# Patient Record
Sex: Female | Born: 1945 | Hispanic: Yes | Smoking: Never smoker
Health system: Southern US, Community
[De-identification: ages and names within clinical notes are randomized; demographics above are authoritative.]

## PROBLEM LIST (undated history)

## (undated) DIAGNOSIS — E785 Hyperlipidemia, unspecified: Secondary | ICD-10-CM

## (undated) DIAGNOSIS — I252 Old myocardial infarction: Secondary | ICD-10-CM

## (undated) DIAGNOSIS — G8929 Other chronic pain: Secondary | ICD-10-CM

## (undated) DIAGNOSIS — C801 Malignant (primary) neoplasm, unspecified: Secondary | ICD-10-CM

## (undated) DIAGNOSIS — I10 Essential (primary) hypertension: Secondary | ICD-10-CM

## (undated) DIAGNOSIS — M25561 Pain in right knee: Secondary | ICD-10-CM

## (undated) HISTORY — DX: Malignant (primary) neoplasm, unspecified: C80.1

## (undated) HISTORY — DX: Hyperlipidemia, unspecified: E78.5

## (undated) HISTORY — PX: BREAST SURGERY: SHX581

## (undated) HISTORY — DX: Other chronic pain: G89.29

## (undated) HISTORY — DX: Essential (primary) hypertension: I10

## (undated) HISTORY — DX: Pain in right knee: M25.561

## (undated) HISTORY — DX: Old myocardial infarction: I25.2

---

## 1984-06-24 DIAGNOSIS — I252 Old myocardial infarction: Secondary | ICD-10-CM

## 1984-06-24 HISTORY — DX: Old myocardial infarction: I25.2

## 2011-11-22 DIAGNOSIS — R002 Palpitations: Secondary | ICD-10-CM | POA: Insufficient documentation

## 2011-11-22 DIAGNOSIS — Z86010 Personal history of colonic polyps: Secondary | ICD-10-CM | POA: Insufficient documentation

## 2011-11-22 DIAGNOSIS — I10 Essential (primary) hypertension: Secondary | ICD-10-CM | POA: Insufficient documentation

## 2011-11-22 DIAGNOSIS — J309 Allergic rhinitis, unspecified: Secondary | ICD-10-CM | POA: Insufficient documentation

## 2013-12-15 DIAGNOSIS — Z0289 Encounter for other administrative examinations: Secondary | ICD-10-CM | POA: Insufficient documentation

## 2014-09-15 DIAGNOSIS — Z91199 Patient's noncompliance with other medical treatment and regimen due to unspecified reason: Secondary | ICD-10-CM | POA: Insufficient documentation

## 2014-09-15 DIAGNOSIS — Z9119 Patient's noncompliance with other medical treatment and regimen: Secondary | ICD-10-CM | POA: Insufficient documentation

## 2014-12-13 DIAGNOSIS — N183 Chronic kidney disease, stage 3 unspecified: Secondary | ICD-10-CM | POA: Insufficient documentation

## 2014-12-13 DIAGNOSIS — N1832 Chronic kidney disease, stage 3b: Secondary | ICD-10-CM | POA: Insufficient documentation

## 2015-07-17 DIAGNOSIS — Z13820 Encounter for screening for osteoporosis: Secondary | ICD-10-CM | POA: Diagnosis not present

## 2015-07-17 DIAGNOSIS — G8929 Other chronic pain: Secondary | ICD-10-CM | POA: Diagnosis not present

## 2015-07-17 DIAGNOSIS — I10 Essential (primary) hypertension: Secondary | ICD-10-CM | POA: Diagnosis not present

## 2015-07-17 DIAGNOSIS — M25569 Pain in unspecified knee: Secondary | ICD-10-CM | POA: Diagnosis not present

## 2015-07-17 DIAGNOSIS — E785 Hyperlipidemia, unspecified: Secondary | ICD-10-CM | POA: Diagnosis not present

## 2015-08-03 DIAGNOSIS — J329 Chronic sinusitis, unspecified: Secondary | ICD-10-CM | POA: Diagnosis not present

## 2015-08-31 ENCOUNTER — Encounter: Payer: Self-pay | Admitting: Family Medicine

## 2015-08-31 DIAGNOSIS — M818 Other osteoporosis without current pathological fracture: Secondary | ICD-10-CM | POA: Diagnosis not present

## 2015-09-05 DIAGNOSIS — M8580 Other specified disorders of bone density and structure, unspecified site: Secondary | ICD-10-CM | POA: Insufficient documentation

## 2015-09-11 DIAGNOSIS — Z79899 Other long term (current) drug therapy: Secondary | ICD-10-CM | POA: Diagnosis not present

## 2015-09-11 DIAGNOSIS — M8580 Other specified disorders of bone density and structure, unspecified site: Secondary | ICD-10-CM | POA: Diagnosis not present

## 2015-09-11 DIAGNOSIS — I10 Essential (primary) hypertension: Secondary | ICD-10-CM | POA: Diagnosis not present

## 2015-10-03 DIAGNOSIS — I10 Essential (primary) hypertension: Secondary | ICD-10-CM | POA: Diagnosis not present

## 2015-10-03 DIAGNOSIS — Z79899 Other long term (current) drug therapy: Secondary | ICD-10-CM | POA: Diagnosis not present

## 2015-10-23 DIAGNOSIS — T2661XA Corrosion of cornea and conjunctival sac, right eye, initial encounter: Secondary | ICD-10-CM | POA: Diagnosis not present

## 2015-10-23 DIAGNOSIS — T543X1A Toxic effect of corrosive alkalis and alkali-like substances, accidental (unintentional), initial encounter: Secondary | ICD-10-CM | POA: Diagnosis not present

## 2015-10-24 DIAGNOSIS — H16141 Punctate keratitis, right eye: Secondary | ICD-10-CM | POA: Diagnosis not present

## 2015-10-26 DIAGNOSIS — H40033 Anatomical narrow angle, bilateral: Secondary | ICD-10-CM | POA: Diagnosis not present

## 2015-10-26 DIAGNOSIS — H1011 Acute atopic conjunctivitis, right eye: Secondary | ICD-10-CM | POA: Diagnosis not present

## 2015-11-02 DIAGNOSIS — H1011 Acute atopic conjunctivitis, right eye: Secondary | ICD-10-CM | POA: Diagnosis not present

## 2015-11-10 DIAGNOSIS — H40033 Anatomical narrow angle, bilateral: Secondary | ICD-10-CM | POA: Diagnosis not present

## 2015-11-13 DIAGNOSIS — H409 Unspecified glaucoma: Secondary | ICD-10-CM | POA: Insufficient documentation

## 2015-11-30 DIAGNOSIS — H2513 Age-related nuclear cataract, bilateral: Secondary | ICD-10-CM | POA: Diagnosis not present

## 2016-01-18 DIAGNOSIS — E784 Other hyperlipidemia: Secondary | ICD-10-CM | POA: Diagnosis not present

## 2016-01-18 DIAGNOSIS — M25552 Pain in left hip: Secondary | ICD-10-CM | POA: Diagnosis not present

## 2016-01-18 DIAGNOSIS — M25551 Pain in right hip: Secondary | ICD-10-CM | POA: Diagnosis not present

## 2016-01-18 DIAGNOSIS — M1710 Unilateral primary osteoarthritis, unspecified knee: Secondary | ICD-10-CM | POA: Diagnosis not present

## 2016-01-18 DIAGNOSIS — T887XXD Unspecified adverse effect of drug or medicament, subsequent encounter: Secondary | ICD-10-CM | POA: Diagnosis not present

## 2016-01-18 DIAGNOSIS — I10 Essential (primary) hypertension: Secondary | ICD-10-CM | POA: Diagnosis not present

## 2016-02-20 DIAGNOSIS — M1710 Unilateral primary osteoarthritis, unspecified knee: Secondary | ICD-10-CM | POA: Diagnosis not present

## 2016-02-20 DIAGNOSIS — T887XXD Unspecified adverse effect of drug or medicament, subsequent encounter: Secondary | ICD-10-CM | POA: Diagnosis not present

## 2016-06-06 DIAGNOSIS — N183 Chronic kidney disease, stage 3 (moderate): Secondary | ICD-10-CM | POA: Diagnosis not present

## 2016-06-06 DIAGNOSIS — G8929 Other chronic pain: Secondary | ICD-10-CM | POA: Diagnosis not present

## 2016-06-06 DIAGNOSIS — M25562 Pain in left knee: Secondary | ICD-10-CM | POA: Diagnosis not present

## 2016-06-06 DIAGNOSIS — I10 Essential (primary) hypertension: Secondary | ICD-10-CM | POA: Diagnosis not present

## 2016-10-07 DIAGNOSIS — I2510 Atherosclerotic heart disease of native coronary artery without angina pectoris: Secondary | ICD-10-CM | POA: Insufficient documentation

## 2016-10-07 DIAGNOSIS — N183 Chronic kidney disease, stage 3 (moderate): Secondary | ICD-10-CM | POA: Diagnosis not present

## 2016-10-07 DIAGNOSIS — M8580 Other specified disorders of bone density and structure, unspecified site: Secondary | ICD-10-CM | POA: Diagnosis not present

## 2016-10-07 DIAGNOSIS — Z1211 Encounter for screening for malignant neoplasm of colon: Secondary | ICD-10-CM | POA: Diagnosis not present

## 2016-10-07 DIAGNOSIS — M25562 Pain in left knee: Secondary | ICD-10-CM | POA: Diagnosis not present

## 2016-10-07 DIAGNOSIS — I10 Essential (primary) hypertension: Secondary | ICD-10-CM | POA: Diagnosis not present

## 2016-10-07 DIAGNOSIS — G8929 Other chronic pain: Secondary | ICD-10-CM | POA: Diagnosis not present

## 2016-10-07 DIAGNOSIS — M25561 Pain in right knee: Secondary | ICD-10-CM | POA: Diagnosis not present

## 2017-03-31 DIAGNOSIS — J310 Chronic rhinitis: Secondary | ICD-10-CM | POA: Diagnosis not present

## 2017-03-31 DIAGNOSIS — I10 Essential (primary) hypertension: Secondary | ICD-10-CM | POA: Diagnosis not present

## 2017-03-31 DIAGNOSIS — L720 Epidermal cyst: Secondary | ICD-10-CM | POA: Diagnosis not present

## 2017-04-03 ENCOUNTER — Encounter: Payer: Self-pay | Admitting: Family Medicine

## 2017-04-03 DIAGNOSIS — R220 Localized swelling, mass and lump, head: Secondary | ICD-10-CM | POA: Diagnosis not present

## 2017-04-26 DIAGNOSIS — L03114 Cellulitis of left upper limb: Secondary | ICD-10-CM | POA: Diagnosis not present

## 2017-04-28 DIAGNOSIS — L720 Epidermal cyst: Secondary | ICD-10-CM | POA: Diagnosis not present

## 2017-04-28 DIAGNOSIS — N183 Chronic kidney disease, stage 3 (moderate): Secondary | ICD-10-CM | POA: Diagnosis not present

## 2017-04-28 DIAGNOSIS — I10 Essential (primary) hypertension: Secondary | ICD-10-CM | POA: Diagnosis not present

## 2017-06-19 DIAGNOSIS — J029 Acute pharyngitis, unspecified: Secondary | ICD-10-CM | POA: Diagnosis not present

## 2017-06-19 DIAGNOSIS — J019 Acute sinusitis, unspecified: Secondary | ICD-10-CM | POA: Diagnosis not present

## 2017-08-07 DIAGNOSIS — M25561 Pain in right knee: Secondary | ICD-10-CM | POA: Diagnosis not present

## 2017-08-07 DIAGNOSIS — L720 Epidermal cyst: Secondary | ICD-10-CM | POA: Diagnosis not present

## 2017-08-08 DIAGNOSIS — M1711 Unilateral primary osteoarthritis, right knee: Secondary | ICD-10-CM | POA: Diagnosis not present

## 2017-08-13 DIAGNOSIS — M1711 Unilateral primary osteoarthritis, right knee: Secondary | ICD-10-CM | POA: Diagnosis not present

## 2017-09-09 DIAGNOSIS — M25561 Pain in right knee: Secondary | ICD-10-CM | POA: Diagnosis not present

## 2017-09-09 DIAGNOSIS — M25562 Pain in left knee: Secondary | ICD-10-CM | POA: Diagnosis not present

## 2017-09-09 DIAGNOSIS — G8929 Other chronic pain: Secondary | ICD-10-CM | POA: Diagnosis not present

## 2017-09-09 DIAGNOSIS — I10 Essential (primary) hypertension: Secondary | ICD-10-CM | POA: Diagnosis not present

## 2017-11-13 ENCOUNTER — Ambulatory Visit (INDEPENDENT_AMBULATORY_CARE_PROVIDER_SITE_OTHER): Payer: Medicare Other | Admitting: Family Medicine

## 2017-11-13 ENCOUNTER — Telehealth: Payer: Self-pay | Admitting: Family Medicine

## 2017-11-13 ENCOUNTER — Other Ambulatory Visit: Payer: Self-pay

## 2017-11-13 ENCOUNTER — Encounter: Payer: Self-pay | Admitting: Family Medicine

## 2017-11-13 VITALS — BP 110/76 | HR 98 | Temp 98.6°F | Ht 60.39 in | Wt 131.4 lb

## 2017-11-13 DIAGNOSIS — E785 Hyperlipidemia, unspecified: Secondary | ICD-10-CM

## 2017-11-13 DIAGNOSIS — I10 Essential (primary) hypertension: Secondary | ICD-10-CM | POA: Diagnosis not present

## 2017-11-13 DIAGNOSIS — Z0000 Encounter for general adult medical examination without abnormal findings: Secondary | ICD-10-CM | POA: Diagnosis not present

## 2017-11-13 DIAGNOSIS — M25561 Pain in right knee: Secondary | ICD-10-CM

## 2017-11-13 DIAGNOSIS — G8929 Other chronic pain: Secondary | ICD-10-CM | POA: Diagnosis not present

## 2017-11-13 DIAGNOSIS — Z5181 Encounter for therapeutic drug level monitoring: Secondary | ICD-10-CM

## 2017-11-13 MED ORDER — TRAMADOL HCL 50 MG PO TABS: 50.0000 mg | ORAL_TABLET | Freq: Two times a day (BID) | ORAL | 1 refills | Status: DC

## 2017-11-13 NOTE — Patient Instructions (Signed)
     IF you received an x-ray today, you will receive an invoice from [facility]. Please contact [facility] at [phone] with questions or concerns regarding your invoice.   IF you received labwork today, you will receive an invoice from [facility]. Please contact [facility] at [phone] with questions or concerns regarding your invoice.   Our billing staff will not be able to assist you with questions regarding bills from these companies.  You will be contacted with the lab results as soon as they are available. The fastest way to get your results is to activate your My Chart account. Instructions are located on the last page of this paperwork. If you have not heard from us regarding the results in 2 weeks, please contact this office.     

## 2017-11-13 NOTE — Progress Notes (Signed)
5/23/20194:16 PM  Gina Ball 03-Jul-1945, 72 y.o. female [id number]  Chief Complaint  Patient presents with  . Establish Care    Just moved here from [place], having right knee pain for 1 yr, has had injections done for the pain with no resolve. Does not want to do that again.   . Medication Refill    needing refill on tramadol for knees pain    HPI:   Patient is a 72 y.o. female with past medical history significant for HTN, chronic pain of right knee, HLP who presents today to establish care  She just moved in with her daughter in [place], [place] from [place], [place] She is requesting refill of tramadol, takes twice a day, wears a sleeve, will do mortin prn, walks daily, does strengthening exercises. Injections provided very minimal relief. Denies any side effects of tramadol.  She takes her BP meds as prescribed. Monitors her salt intake, exercises and keeps a healthy weight She denies any side effects from medications She used to walk 10 miles in [place] wo any cardiac sx, working on finding route in [place]  She manages her cholesterol with LFM, her last LDL in Oct 129, her HDL 71  She reports a normal colonoscopy years ago, done at [facility], does not which to repeat She reports both pneumonia vaccines about 4-5 years ago She had a dexa in 08/26/2015 Has not had a mammogram in years, declines [facility]ova withness, widowed  Does not need refill of BP meds yet [employer] order will start June 1st  X-rays were reviewed of the right knee. These were taken on 08/13/17 and 08/07/17. These demonstrate no fracture or dislocation. There is medial joint space narrowing. There are some peripheral osteophytes seen along the lateral femoral condyle. The patella is well centered. Normal bone quality.   Records reviewed via care everywhere Last knee injection done 07/2017 Last tramadol refill given 08/2017 by previous PCP [facility] CSR reviewed, appropriate    Fall Risk  11/13/2017  Falls in the  past year? No     Depression screen PHQ 2/9 11/13/2017  Decreased Interest 0  Down, Depressed, Hopeless 0  PHQ - 2 Score 0    No Known Allergies  Prior to Admission medications   Medication Sig Start Date End Date Taking? Authorizing Provider  hydrochlorothiazide (HYDRODIURIL) 25 MG tablet Take 25 mg by mouth daily.   Yes [provider]  lisinopril (PRINIVIL,ZESTRIL) 40 MG tablet Take 40 mg by mouth daily.   Yes [provider]  traMADol (ULTRAM) 50 MG tablet Take by mouth every 6 (six) hours as needed.   Yes [provider]    Past Medical History:  Diagnosis Date  . Cancer ([place])    HAD BREST CANCER 1977  . Chronic pain of right knee   . H/O myocardial infarction, greater than 8 weeks 1986   reports normal NM Scan  . HTN (hypertension)   . Hyperlipidemia     Past Surgical History:  Procedure Laterality Date  . BREAST SURGERY     BILATERAL    Social History   Tobacco Use  . Smoking status: Never Smoker  . Smokeless tobacco: Never Used  Substance Use Topics  . Alcohol use: Yes    Comment: OCCASIONALLY    Family History  Problem Relation Age of Onset  . Healthy Daughter   . Lymphoma Son     Review of Systems  Constitutional: Negative for chills, fever, malaise/fatigue and weight loss.  HENT:  Negative for congestion, ear pain and sore throat.   Eyes: Negative for blurred vision and double vision.  Respiratory: Negative for cough and shortness of breath.   Cardiovascular: Negative for chest pain, palpitations and leg swelling.  Gastrointestinal: Negative for abdominal pain, blood in stool, constipation, diarrhea, melena, nausea and vomiting.  Genitourinary: Negative for dysuria and hematuria.  Musculoskeletal: Positive for joint pain. Negative for falls.  Neurological: Negative for dizziness, focal weakness and headaches.  Psychiatric/Behavioral: Negative for depression. The patient is not nervous/anxious and does not have insomnia.    All other systems reviewed and are negative.    OBJECTIVE:  Blood pressure 110/76, pulse 98, temperature 98.6 F (37 C), temperature source Oral, height 5' 0.39" (1.534 m), weight 131 lb 6.4 oz (59.6 kg), SpO2 99 %.  Physical Exam  Constitutional: She is oriented to person, place, and time. She appears well-developed and well-nourished.  HENT:  Head: Normocephalic and atraumatic.  Right Ear: Hearing, tympanic membrane, external ear and ear canal normal.  Left Ear: Hearing, tympanic membrane, external ear and ear canal normal.  Mouth/Throat: Oropharynx is clear and moist.  Eyes: Pupils are equal, round, and reactive to light. EOM are normal.  Neck: Neck supple. No thyromegaly present.  Cardiovascular: Normal rate, regular rhythm, normal heart sounds and intact distal pulses. Exam reveals no gallop and no friction rub.  No murmur heard. Pulmonary/Chest: Effort normal and breath sounds normal. She has no wheezes. She has no rales.  Abdominal: Soft. Bowel sounds are normal. She exhibits no distension and no mass. There is no tenderness.  Musculoskeletal: Normal range of motion. She exhibits no edema.  Lymphadenopathy:    She has no cervical adenopathy.  Neurological: She is alert and oriented to person, place, and time. She has normal reflexes.  Skin: Skin is warm and dry.  Psychiatric: She has a normal mood and affect.  Nursing note and vitals reviewed.   ASSESSMENT and PLAN  1. Essential hypertension, benign Controlled, cont current regime. She will call for refills when needed. - CBC - Comprehensive metabolic panel - TSH  2. Chronic pain of right knee Previous records and [facility] CSR reviewed. I will assume care of her tramadol rx. Plan to transition to [facility] mail order pharmacy once in place, but for now will [facility]. - ToxASSURE Select 13 (MW), Urine - Tramadol Screen, Urine - traMADol (ULTRAM) 50 MG tablet; Take 1 tablet (50 mg total) by mouth 2 (two) times daily.  3.  Hyperlipidemia, unspecified hyperlipidemia type Last LDL ok. Patient manages with LFM, declines statin therapy - Lipid panel - TSH  4. Encounter for therapeutic drug monitoring - ToxASSURE Select 13 (MW), Urine - Tramadol Screen, Urine  5. Encounter for medical examination to establish care PMH, PSH, meds, allergies, Fhx, Shx, reviewed with patient today.   Return in about 6 months (around 05/16/2018).    Rutherford Guys, MD Primary Care at [place], [place] [address] Ph.  [phone] Fax [phone]

## 2017-11-13 NOTE — Telephone Encounter (Signed)
Copied from [place] [id number]. Topic: Quick Communication - Rx Refill/Question >> Nov 13, 2017  6:24 PM Marin Olp L wrote: Medication: Tramedol (need to confirm it;s okay to fill early)  Has the patient contacted their pharmacy? Yes.   (Agent: If no, request that the patient contact the pharmacy for the refill.) (Agent: If yes, when and what did the pharmacy advise?)  Preferred Pharmacy (with phone number or street name): [facility] [id number], [place] - [place] [address] [place] [address] Phone: [phone] Fax: [phone]  Agent: Please be advised that RX refills may take up to 3 business days. We ask that you follow-up with your pharmacy.  Was filled on 09/09/2017 at [facility] in [place].

## 2017-11-14 LAB — COMPREHENSIVE METABOLIC PANEL
ALT: 14 IU/L (ref 0–32)
AST: 19 IU/L (ref 0–40)
Albumin/Globulin Ratio: 1.7 (ref 1.2–2.2)
Albumin: 4.3 g/dL (ref 3.5–4.8)
Alkaline Phosphatase: 44 IU/L (ref 39–117)
BUN/Creatinine Ratio: 23 (ref 12–28)
BUN: 26 mg/dL (ref 8–27)
Bilirubin Total: 0.3 mg/dL (ref 0.0–1.2)
CO2: 24 mmol/L (ref 20–29)
Calcium: 9.3 mg/dL (ref 8.7–10.3)
Chloride: 107 mmol/L — ABNORMAL HIGH (ref 96–106)
Creatinine, Ser: 1.11 mg/dL — ABNORMAL HIGH (ref 0.57–1.00)
GFR calc Af Amer: 57 mL/min/{1.73_m2} — ABNORMAL LOW (ref >59)
GFR calc non Af Amer: 50 mL/min/{1.73_m2} — ABNORMAL LOW (ref >59)
Globulin, Total: 2.5 g/dL (ref 1.5–4.5)
Glucose: 86 mg/dL (ref 65–99)
Potassium: 4.2 mmol/L (ref 3.5–5.2)
Sodium: 147 mmol/L — ABNORMAL HIGH (ref 134–144)
Total Protein: 6.8 g/dL (ref 6.0–8.5)

## 2017-11-14 LAB — LIPID PANEL
Chol/HDL Ratio: 3.3 ratio (ref 0.0–4.4)
Cholesterol, Total: 229 mg/dL — ABNORMAL HIGH (ref 100–199)
HDL: 70 mg/dL (ref >39)
LDL Calculated: 111 mg/dL — ABNORMAL HIGH (ref 0–99)
Triglycerides: 239 mg/dL — ABNORMAL HIGH (ref 0–149)
VLDL Cholesterol Cal: 48 mg/dL — ABNORMAL HIGH (ref 5–40)

## 2017-11-14 LAB — TSH: TSH: 1.85 u[IU]/mL (ref 0.450–4.500)

## 2017-11-14 LAB — CBC
Hematocrit: 33 % — ABNORMAL LOW (ref 34.0–46.6)
Hemoglobin: 10.9 g/dL — ABNORMAL LOW (ref 11.1–15.9)
MCH: 31.1 pg (ref 26.6–33.0)
MCHC: 33 g/dL (ref 31.5–35.7)
MCV: 94 fL (ref 79–97)
Platelets: 282 10*3/uL (ref 150–450)
RBC: 3.5 x10E6/uL — ABNORMAL LOW (ref 3.77–5.28)
RDW: 12.9 % (ref 12.3–15.4)
WBC: 4.6 10*3/uL (ref 3.4–10.8)

## 2017-11-14 NOTE — Telephone Encounter (Signed)
Phone call to [facility], relayed it is OK to dispense tramadol to patient.

## 2017-11-22 LAB — TOXASSURE SELECT 13 (MW), URINE

## 2017-11-22 LAB — TRAMADOL SCREEN, URINE: Tramadol Screen, Urine: POSITIVE ng/mL — AB

## 2017-12-02 ENCOUNTER — Ambulatory Visit (INDEPENDENT_AMBULATORY_CARE_PROVIDER_SITE_OTHER): Payer: Medicare HMO | Admitting: Family Medicine

## 2017-12-02 ENCOUNTER — Other Ambulatory Visit: Payer: Self-pay

## 2017-12-02 ENCOUNTER — Encounter: Payer: Self-pay | Admitting: Family Medicine

## 2017-12-02 VITALS — BP 128/70 | HR 79 | Temp 98.6°F | Resp 16 | Ht 60.39 in | Wt 131.0 lb

## 2017-12-02 DIAGNOSIS — E785 Hyperlipidemia, unspecified: Secondary | ICD-10-CM

## 2017-12-02 DIAGNOSIS — I10 Essential (primary) hypertension: Secondary | ICD-10-CM

## 2017-12-02 DIAGNOSIS — M1711 Unilateral primary osteoarthritis, right knee: Secondary | ICD-10-CM | POA: Diagnosis not present

## 2017-12-02 MED ORDER — LISINOPRIL 40 MG PO TABS
40.0000 mg | ORAL_TABLET | Freq: Every day | ORAL | 3 refills | Status: DC
Start: 2017-12-02 — End: 2018-09-22

## 2017-12-02 MED ORDER — HYDROCHLOROTHIAZIDE 25 MG PO TABS: 25.0000 mg | ORAL_TABLET | Freq: Every day | ORAL | 3 refills | Status: DC

## 2017-12-02 MED ORDER — METHOCARBAMOL 500 MG PO TABS: 500.0000 mg | ORAL_TABLET | Freq: Three times a day (TID) | ORAL | 0 refills | Status: DC | PRN

## 2017-12-02 MED ORDER — TRAMADOL HCL 50 MG PO TABS: 50.0000 mg | ORAL_TABLET | Freq: Two times a day (BID) | ORAL | 5 refills | Status: DC

## 2017-12-02 NOTE — Patient Instructions (Addendum)
  I have ordered either a referral or a diagnostic image. Please be mindful that it usually takes about 2 weeks to be called for an appointment. However if you have not be called for your appointment, please call us at [phone] and ask to speak with a referral clerk to further inquire about either your referral of diagnostic image.   IF you received an x-ray today, you will receive an invoice from [facility]. Please contact [facility] at [phone] with questions or concerns regarding your invoice.   IF you received labwork today, you will receive an invoice from [facility]. Please contact [facility] at [phone] with questions or concerns regarding your invoice.   Our billing staff will not be able to assist you with questions regarding bills from these companies.  You will be contacted with the lab results as soon as they are available. The fastest way to get your results is to activate your My Chart account. Instructions are located on the last page of this paperwork. If you have not heard from us regarding the results in 2 weeks, please contact this office.     

## 2017-12-02 NOTE — Progress Notes (Signed)
6/11/20199:37 AM  Gina Ball 02-21-1946, 72 y.o. female [id number]  Chief Complaint  Patient presents with  . Right knee (its the muscle to the side of the knee) x 2  yrs    over past few months worsening, pain level 10/10 and tramadol not helping with the pain, ? steroid injection, last one she received in Feb didn't help, pt unable to sleep, walk or drive due to pain.     HPI:   Patient is a 72 y.o. female with past medical history significant for HTN, HLP and chronic R knee pain 2/2 DJD who presents today for worsening right knee pain  It is constant, medial aspect. Worse at night, sleeping with knee slightly been helps some but then causes spasms of her thigh muscles.  Flexeril in the past caused dizziness, confusion She has not done PT, steroid injection did not help She has not established with ortho here in [place]  She is also requesting meds to be changed to [place] for 90 days  Seen by ortho, Dr Alain Marion in Feb 2019, xrays c/w medial compartment DJD with bone spurs. Steroid injection given  Fall Risk  12/02/2017 11/13/2017  Falls in the past year? No No     Depression screen [facility] 2/9 12/02/2017 11/13/2017  Decreased Interest 0 0  Down, Depressed, Hopeless 0 0  PHQ - 2 Score 0 0    No Known Allergies  Prior to Admission medications   Medication Sig Start Date End Date Taking? Authorizing Provider  aspirin EC 81 MG tablet Take 81 mg by mouth daily.   Yes [provider]  B Complex-Biotin-FA (B-100 B-COMPLEX) TABS Take by mouth.   Yes [provider]  Biotin 1 MG CAPS Take 1 capsule by mouth daily.   Yes [provider]  diphenhydrAMINE (BENADRYL ALLERGY) 25 mg capsule Take 25 mg by mouth daily as needed for allergies.   Yes [provider]  hydrochlorothiazide (HYDRODIURIL) 25 MG tablet Take 25 mg by mouth daily.   Yes [provider]  lisinopril (PRINIVIL,ZESTRIL) 40 MG tablet Take 40 mg by mouth daily.   Yes [provider]  Multiple Vitamin (MULTIVITAMIN) tablet Take 1 tablet by mouth daily.   Yes [provider]  traMADol (ULTRAM) 50 MG tablet Take 1 tablet (50 mg total) by mouth 2 (two) times daily. 11/13/17  Yes Rutherford Guys, MD    Past Medical History:  Diagnosis Date  . Cancer ([place])    HAD BREST CANCER 1977  . Chronic pain of right knee   . H/O myocardial infarction, greater than 8 weeks 1986   reports normal NM Scan  . HTN (hypertension)   . Hyperlipidemia     Past Surgical History:  Procedure Laterality Date  . BREAST SURGERY     BILATERAL    Social History   Tobacco Use  . Smoking status: Never Smoker  . Smokeless tobacco: Never Used  Substance Use Topics  . Alcohol use: Yes    Comment: OCCASIONALLY    Family History  Problem Relation Age of Onset  . Healthy Daughter   . Lymphoma Son     Review of Systems  Constitutional: Negative for chills and fever.  Respiratory: Negative for cough and shortness of breath.   Cardiovascular: Negative for chest pain, palpitations and leg swelling.  Gastrointestinal: Negative for abdominal pain, nausea and vomiting.     OBJECTIVE:  Blood pressure 128/70, pulse 79, temperature 98.6 F (37 C), temperature source  Oral, resp. rate 16, height 5' 0.39" (1.534 m), weight 131 lb (59.4 kg), SpO2 98 %.  Physical Exam  Constitutional: She is oriented to person, place, and time.  HENT:  Head: Normocephalic and atraumatic.  Mouth/Throat: Mucous membranes are normal.  Eyes: Pupils are equal, round, and reactive to light. EOM are normal. No scleral icterus.  Neck: Neck supple.  Pulmonary/Chest: Effort normal.  Neurological: She is alert and oriented to person, place, and time.  Skin: Skin is warm and dry.  Nursing note and vitals reviewed.   ASSESSMENT and PLAN  1. Primary osteoarthritis of right knee Uncontrolled. Referring to PT to help with quad stretching and mobility. Adding methocarbamol specially at bedtime  to help with spasms. Referring to ortho to eval for surgical interventions. New med r/se/b reviewed. - Ambulatory referral to Physical Therapy - Ambulatory referral to Orthopedic Surgery  2. Essential hypertension, benign At goal, cont current regime  3. Hyperlipidemia, unspecified hyperlipidemia type LDL 111, patient prefers to continue with dietary modifications.   Other orders - methocarbamol (ROBAXIN) 500 MG tablet; Take 1 tablet (500 mg total) by mouth every 8 (eight) hours as needed for muscle spasms.- [facility] - hydrochlorothiazide (HYDRODIURIL) 25 MG tablet; Take 1 tablet (25 mg total) by mouth daily. - sent to [facility] - lisinopril (PRINIVIL,ZESTRIL) 40 MG tablet; Take 1 tablet (40 mg total) by mouth daily.- sent to [facility] - traMADol (ULTRAM) 50 MG tablet; Take 1 tablet (50 mg total) by mouth 2 (two) times daily.- sent to [facility]  Return in about 3 months (around 03/04/2018).    Rutherford Guys, MD Primary Care at [place], [place] [address] Ph.  [phone] Fax [phone]

## 2017-12-11 ENCOUNTER — Ambulatory Visit (INDEPENDENT_AMBULATORY_CARE_PROVIDER_SITE_OTHER): Payer: Medicare HMO

## 2017-12-11 ENCOUNTER — Encounter (INDEPENDENT_AMBULATORY_CARE_PROVIDER_SITE_OTHER): Payer: Self-pay | Admitting: Orthopaedic Surgery

## 2017-12-11 ENCOUNTER — Ambulatory Visit (INDEPENDENT_AMBULATORY_CARE_PROVIDER_SITE_OTHER): Payer: Medicare HMO | Admitting: Orthopaedic Surgery

## 2017-12-11 VITALS — BP 132/73 | HR 83 | Ht 60.38 in | Wt 131.0 lb

## 2017-12-11 DIAGNOSIS — M25561 Pain in right knee: Secondary | ICD-10-CM | POA: Diagnosis not present

## 2017-12-11 DIAGNOSIS — G8929 Other chronic pain: Secondary | ICD-10-CM | POA: Diagnosis not present

## 2017-12-11 MED ORDER — BUPIVACAINE HCL 0.5 % IJ SOLN
2.0000 mL | INTRAMUSCULAR | Status: AC | PRN
  Administered 2017-12-11: 2 mL via INTRA_ARTICULAR

## 2017-12-11 MED ORDER — LIDOCAINE HCL 1 % IJ SOLN
2.0000 mL | INTRAMUSCULAR | Status: AC | PRN
  Administered 2017-12-11: 2 mL

## 2017-12-11 MED ORDER — METHYLPREDNISOLONE ACETATE 40 MG/ML IJ SUSP
80.0000 mg | INTRAMUSCULAR | Status: AC | PRN
  Administered 2017-12-11: 80 mg

## 2017-12-11 NOTE — Progress Notes (Signed)
Office Visit Note   Patient: Gina Ball           Date of Birth: 1945/11/16           MRN: [id number] Visit Date: 12/11/2017              Requested by: Rutherford Guys, MD [address] [place], [place] [address] PCP: Rutherford Guys, MD   Assessment & Plan: Visit Diagnoses:  1. Chronic pain of right knee     Plan: Moderate tricompartmental osteoarthritis right knee.  Long discussion regarding diagnosis and treatment options.  Will inject the knee with cortisone and monitor response over the next 3 to 4 weeks.  Consider Visco supplementation.  Patient is aware of the total knee replacement option  Follow-Up Instructions: Return if symptoms worsen or fail to improve.   Orders:  Orders Placed This Encounter  Procedures  . Large Joint Inj: R knee  . XR KNEE 3 VIEW RIGHT   No orders of the defined types were placed in this encounter.     Procedures: Large Joint Inj: R knee on 12/11/2017 12:08 PM Indications: pain and diagnostic evaluation Details: 25 G 1.5 in needle, anteromedial approach  Arthrogram: No  Medications: 2 mL lidocaine 1 %; 2 mL bupivacaine 0.5 %; 80 mg methylPREDNISolone acetate 40 MG/ML Procedure, treatment alternatives, risks and benefits explained, specific risks discussed. Consent was given by the patient. Immediately prior to procedure a time out was called to verify the correct patient, procedure, equipment, support staff and site/side marked as required. Patient was prepped and draped in the usual sterile fashion.       Clinical Data: No additional findings.   Subjective: Chief Complaint  Patient presents with  . Right Knee - Pain  . New Patient (Initial Visit)    r knee pain for 1-2 yrs getting worse, had injection w Gina Ball in feb. helped just a little. having medial pain  Gina Ball recently moved to the [place] area from [place] to be with her family.  She is had a long history of right knee problems treated in [place].  She  has a diagnosis of osteoarthritis via x-rays.  We have a copy of her x-ray report from February 2019.  The report notes moderate bilateral medial compartment degenerative change without evidence of fracture or dislocation. she has had several cortisone injections with relief. Now having repeat symptoms of medial joint pain and stiffness.No related hip or thigh pain. She has been apprised of the different treatment options per her orthopedist in [place] and wants to avoid surgery  HPI  Review of Systems  Constitutional: Negative for fatigue and fever.  HENT: Negative for ear pain.   Eyes: Negative for pain.  Respiratory: Negative for cough and shortness of breath.   Cardiovascular: Positive for leg swelling.  Gastrointestinal: Negative for constipation and diarrhea.  Genitourinary: Negative for difficulty urinating.  Musculoskeletal: Negative for back pain and neck pain.  Skin: Negative for rash.  Allergic/Immunologic: Negative for food allergies.  Neurological: Positive for weakness. Negative for numbness.  Hematological: Does not bruise/bleed easily.  Psychiatric/Behavioral: Positive for sleep disturbance.     Objective: Vital Signs: BP 132/73 (BP Location: Left Arm, Patient Position: Sitting, Cuff Size: Normal)   Pulse 83   Ht 5' 0.38" (1.534 m)   Wt 131 lb (59.4 kg)   BMI 25.26 kg/m   Physical Exam  Constitutional: She is oriented to person, place, and time. She appears well-developed and well-nourished.  HENT:  Mouth/Throat: Oropharynx is clear and moist.  Eyes: Pupils are equal, round, and reactive to light. EOM are normal.  Pulmonary/Chest: Effort normal.  Neurological: She is alert and oriented to person, place, and time.  Skin: Skin is warm and dry.  Psychiatric: She has a normal mood and affect. Her behavior is normal.    Ortho Exam awake alert and oriented x3.  Comfortable sitting right knee with slight increased varus.  No effusion.  Predominantly medial joint  pain diffusely mild.  No lateral joint pain.  Some patellar crepitation.  Full extension flexed over 100 degrees without instability.  No calf pain.  No popliteal fullness.  No distal edema.  Right foot is warm.  Straight leg raise negative.  Painless range of motion both hips Specialty Comments:  No specialty comments available.  Imaging: Xr Knee 3 View Right  Result Date: 12/11/2017 Films of the right knee were obtained in several projections standing.  There is significant decrease in the medial joint space with irregularity of the joint surfaces and small peripheral osteophytes.  Some subchondral sclerosis in the tibial side of the joint.  Approximately 1 degree of varus.  No acute changes patellofemoral joint arthritis predominantly along the lateral compartment where there is some mild tilting and peripheral osteophyte formation. no ectopic calcification    PMFS History: Patient Active Problem List   Diagnosis Date Noted  . Hyperlipidemia 12/02/2017  . Primary osteoarthritis of right knee 12/02/2017  . Essential hypertension, benign 11/13/2017  . Chronic pain of right knee 11/13/2017   Past Medical History:  Diagnosis Date  . Cancer ([place])    HAD BREST CANCER 1977  . Chronic pain of right knee   . H/O myocardial infarction, greater than 8 weeks 1986   reports normal NM Scan  . HTN (hypertension)   . Hyperlipidemia     Family History  Problem Relation Age of Onset  . Healthy Daughter   . Lymphoma Son     Past Surgical History:  Procedure Laterality Date  . BREAST SURGERY     BILATERAL   Social History   Occupational History  . Not on file  Tobacco Use  . Smoking status: Never Smoker  . Smokeless tobacco: Never Used  Substance and Sexual Activity  . Alcohol use: Yes    Comment: OCCASIONALLY  . Drug use: Not Currently  . Sexual activity: Not Currently

## 2018-01-05 ENCOUNTER — Ambulatory Visit: Payer: Medicare HMO | Attending: Family Medicine | Admitting: Physical Therapy

## 2018-01-30 ENCOUNTER — Telehealth: Payer: Self-pay | Admitting: Family Medicine

## 2018-01-30 NOTE — Telephone Encounter (Signed)
Called pt to reschedule their appt 03/05/18. Left voice mail to call back

## 2018-03-05 ENCOUNTER — Ambulatory Visit: Payer: Medicare Other | Admitting: Family Medicine

## 2018-07-13 ENCOUNTER — Telehealth: Payer: Self-pay | Admitting: Family Medicine

## 2018-07-13 ENCOUNTER — Other Ambulatory Visit: Payer: Self-pay | Admitting: Family Medicine

## 2018-07-13 NOTE — Telephone Encounter (Signed)
Please Advise

## 2018-07-13 NOTE — Telephone Encounter (Signed)
Patent needs to be seen prior to refill, please schedule. thanls

## 2018-07-13 NOTE — Telephone Encounter (Signed)
Copied from [place] [id number]. Topic: Quick Communication - Rx Refill/Question >> Jul 13, 2018  2:26 PM Rayann Heman wrote: Medication: traMADol (ULTRAM) 50 MG tablet [[id number]]   Has the patient contacted their pharmacy?yes Preferred Pharmacy (with phone number or street name): [facility], [place] [phone] (Phone) [phone] (Fax)   Agent: Please be advised that RX refills may take up to 3 business days. We ask that you follow-up with your pharmacy.

## 2018-07-13 NOTE — Telephone Encounter (Signed)
pmp reviewed, does not take daily Last OV June 2019 Needs to be seen Med refill denied

## 2018-07-14 NOTE — Telephone Encounter (Signed)
Please advise 

## 2018-07-15 NOTE — Telephone Encounter (Signed)
Patent needs to be seen prior to refill, please schedule. thanks

## 2018-07-16 MED ORDER — TRAMADOL HCL 50 MG PO TABS: 50.0000 mg | ORAL_TABLET | Freq: Two times a day (BID) | ORAL | 2 refills | Status: DC

## 2018-07-16 NOTE — Telephone Encounter (Signed)
pmp reviewed Med refilled  Has appt in feb 2020

## 2018-08-13 DIAGNOSIS — B029 Zoster without complications: Secondary | ICD-10-CM | POA: Diagnosis not present

## 2018-08-17 ENCOUNTER — Other Ambulatory Visit: Payer: Self-pay

## 2018-08-17 ENCOUNTER — Ambulatory Visit: Payer: Self-pay | Admitting: *Deleted

## 2018-08-17 ENCOUNTER — Emergency Department (HOSPITAL_COMMUNITY)
Admission: EM | Admit: 2018-08-17 | Discharge: 2018-08-17 | Disposition: A | Discharge status: Home / Self Care | Payer: Medicare HMO | Attending: Emergency Medicine | Admitting: Emergency Medicine

## 2018-08-17 ENCOUNTER — Encounter (HOSPITAL_COMMUNITY): Payer: Self-pay

## 2018-08-17 DIAGNOSIS — I252 Old myocardial infarction: Secondary | ICD-10-CM | POA: Insufficient documentation

## 2018-08-17 DIAGNOSIS — Z853 Personal history of malignant neoplasm of breast: Secondary | ICD-10-CM | POA: Diagnosis not present

## 2018-08-17 DIAGNOSIS — K5900 Constipation, unspecified: Secondary | ICD-10-CM | POA: Insufficient documentation

## 2018-08-17 DIAGNOSIS — Z7982 Long term (current) use of aspirin: Secondary | ICD-10-CM | POA: Diagnosis not present

## 2018-08-17 DIAGNOSIS — B029 Zoster without complications: Secondary | ICD-10-CM | POA: Insufficient documentation

## 2018-08-17 DIAGNOSIS — I10 Essential (primary) hypertension: Secondary | ICD-10-CM | POA: Diagnosis not present

## 2018-08-17 DIAGNOSIS — Z79899 Other long term (current) drug therapy: Secondary | ICD-10-CM | POA: Diagnosis not present

## 2018-08-17 DIAGNOSIS — R21 Rash and other nonspecific skin eruption: Secondary | ICD-10-CM | POA: Diagnosis present

## 2018-08-17 MED ORDER — GABAPENTIN 300 MG PO CAPS
300.0000 mg | ORAL_CAPSULE | Freq: Every day | ORAL | 0 refills | Status: DC
Start: 2018-08-17 — End: 2018-08-17

## 2018-08-17 MED ORDER — GABAPENTIN 300 MG PO CAPS: 300.0000 mg | ORAL_CAPSULE | Freq: Every day | ORAL | 0 refills | Status: DC

## 2018-08-17 MED ORDER — GABAPENTIN 300 MG PO CAPS
300.0000 mg | ORAL_CAPSULE | Freq: Once | ORAL | Status: AC
  Administered 2018-08-17: 300 mg via ORAL
  Filled 2018-08-17: qty 1

## 2018-08-17 NOTE — ED Notes (Signed)
Bed: WLPT4 Expected date:  Expected time:  Means of arrival:  Comments: 

## 2018-08-17 NOTE — ED Provider Notes (Signed)
[facility] DEPT Provider Note   CSN: [id number] Arrival date & time: 08/17/18  1308    History   Chief Complaint Chief Complaint  Patient presents with  . Gina Ball    HPI Gina Ball is a 73 y.o. female.     73yo female presents with complaint of pain from shingles. Patient reports onset of shingles Wednesday last week, went to [facility] and was started on prednisone and Valtrex which she is taking. Reports ongoing pain, worse at night, not taking medication prescribed for sleeping as she has never taken this medicine before. Also reports constipation which she relates to being less active than normal. No other complaints or concerns.      Past Medical History:  Diagnosis Date  . Cancer ([place])    HAD BREST CANCER 1977  . Chronic pain of right knee   . H/O myocardial infarction, greater than 8 weeks 1986   reports normal NM Scan  . HTN (hypertension)   . Hyperlipidemia     Patient Active Problem List   Diagnosis Date Noted  . Hyperlipidemia 12/02/2017  . Primary osteoarthritis of right knee 12/02/2017  . Essential hypertension, benign 11/13/2017  . Chronic pain of right knee 11/13/2017    Past Surgical History:  Procedure Laterality Date  . BREAST SURGERY     BILATERAL     OB History   No obstetric history on file.      Home Medications    Prior to Admission medications   Medication Sig Start Date End Date Taking? Authorizing Provider  aspirin EC 81 MG tablet Take 81 mg by mouth daily.    [provider]  B Complex-Biotin-FA (B-100 B-COMPLEX) TABS Take by mouth.    [provider]  Biotin 1 MG CAPS Take 1 capsule by mouth daily.    [provider]  diphenhydrAMINE (BENADRYL ALLERGY) 25 mg capsule Take 25 mg by mouth daily as needed for allergies.    [provider]  gabapentin (NEURONTIN) 300 MG capsule Take 1 capsule (300 mg total) by mouth daily for 10 days. 08/17/18 08/27/18  Tacy Learn, PA-C  hydrochlorothiazide (HYDRODIURIL) 25 MG tablet Take 1 tablet (25 mg total) by mouth daily. 12/02/17   Rutherford Guys, MD  lisinopril (PRINIVIL,ZESTRIL) 40 MG tablet Take 1 tablet (40 mg total) by mouth daily. 12/02/17   Rutherford Guys, MD  methocarbamol (ROBAXIN) 500 MG tablet Take 1 tablet (500 mg total) by mouth every 8 (eight) hours as needed for muscle spasms. Patient not taking: Reported on 12/11/2017 12/02/17   Rutherford Guys, MD  Multiple Vitamin (MULTIVITAMIN) tablet Take 1 tablet by mouth daily.    [provider]  traMADol (ULTRAM) 50 MG tablet Take 1 tablet (50 mg total) by mouth 2 (two) times daily. 07/16/18   Rutherford Guys, MD    Family History Family History  Problem Relation Age of Onset  . Healthy Daughter   . Lymphoma Son     Social History Social History   Tobacco Use  . Smoking status: Never Smoker  . Smokeless tobacco: Never Used  Substance Use Topics  . Alcohol use: Yes    Comment: OCCASIONALLY  . Drug use: Not Currently     Allergies   Patient has no known allergies.   Review of Systems Review of Systems  Constitutional: Negative for fever.  Respiratory: Negative for shortness of breath.   Cardiovascular: Negative for chest pain.  Gastrointestinal: Positive for constipation.  Negative for abdominal pain, nausea and vomiting.  Skin: Positive for rash.  Allergic/Immunologic: Negative for immunocompromised state.  Neurological: Negative for dizziness and weakness.  Psychiatric/Behavioral: Negative for confusion.  All other systems reviewed and are negative.    Physical Exam Updated Vital Signs BP (!) 141/72 (BP Location: Right Arm)   Pulse 79   Temp 98.5 F (36.9 C) (Oral)   Resp 17   Ht 5' (1.524 m)   Wt 58.1 kg   SpO2 100%   BMI 25.00 kg/m   Physical Exam Vitals signs and nursing note reviewed.  Constitutional:      General: She is not in acute distress.    Appearance: She is well-developed. She is not  diaphoretic.  HENT:     Head: Normocephalic and atraumatic.  Pulmonary:     Effort: Pulmonary effort is normal.  Chest:       Comments: Pain with palpation to left chest wall in area of rash, clustered lesions,crusted, no current vesicles, no signs of secondary infection.  Skin:    General: Skin is warm and dry.     Findings: Rash present.  Neurological:     Mental Status: She is alert and oriented to person, place, and time.  Psychiatric:        Behavior: Behavior normal.      ED Treatments / Results  Labs (all labs ordered are listed, but only abnormal results are displayed) Labs Reviewed - No data to display  EKG None  Radiology No results found.  Procedures Procedures (including critical care time)  Medications Ordered in ED Medications  gabapentin (NEURONTIN) capsule 300 mg (has no administration in time range)     Initial Impression / Assessment and Plan / ED Course  I have reviewed the triage vital signs and the nursing notes.  Pertinent labs & imaging results that were available during my care of the patient were reviewed by me and considered in my medical decision making (see chart for details).  73 year old female with recent diagnosis of shingles presents with ongoing pain.  Patient states the pain is keeping her up at night however does not want to take the medication prescribed for her for sleep but she has never taken this for before.  On exam patient has rash consistent with shingles around left chest wall and breast, lesions do not cross the midline, no signs of secondary infection.  Patient also reports constipation.  Advised patient to take MiraLAX and Colace, given prescription for Neurontin for pain and advised to take medication to help with sleep and follow-up with her PCP.  Final Clinical Impressions(s) / ED Diagnoses   Final diagnoses:  Gina Ball without complication  Constipation, unspecified constipation type    ED Discharge Orders           Ordered    gabapentin (NEURONTIN) 300 MG capsule  Daily,   Status:  Discontinued     08/17/18 1441    gabapentin (NEURONTIN) 300 MG capsule  Daily     08/17/18 1443           Tacy Learn, PA-C 08/17/18 1454    Hayden Rasmussen, MD 08/18/18 1049

## 2018-08-17 NOTE — ED Triage Notes (Signed)
Pt states she was dx last week with shingles. Pt states she has shingles on her left arm and chest. Pt states that she was given prednisone without relief. Reddened area with crusting noted on chest. Reddened areas without weeping noted on left arm. Pt states she has been unable to sleep.

## 2018-08-17 NOTE — Telephone Encounter (Signed)
Patient was seen Thrusday for shingles- patient is having extreme pain- she is taking antiviral and prednisone.  Reason for Disposition . [1] MODERATE difficulty breathing (e.g., speaks in phrases, SOB even at rest, pulse 100-120) AND [2] NEW-onset or WORSE than normal  Answer Assessment - Initial Assessment Questions 1. RESPIRATORY STATUS: "Describe your breathing?" (e.g., wheezing, shortness of breath, unable to speak, severe coughing)      Chest pain is affecting her breathing- patient is short winded due to the pain 2. ONSET: "When did this breathing problem begin?"      4 am patient states she started having trouble with her breathing 3. PATTERN "Does the difficult breathing come and go, or has it been constant since it started?"      Constant since it started 4. SEVERITY: "How bad is your breathing?" (e.g., mild, moderate, severe)    - MILD: No SOB at rest, mild SOB with walking, speaks normally in sentences, can lay down, no retractions, pulse < 100.    - MODERATE: SOB at rest, SOB with minimal exertion and prefers to sit, cannot lie down flat, speaks in phrases, mild retractions, audible wheezing, pulse 100-120.    - SEVERE: Very SOB at rest, speaks in single words, struggling to breathe, sitting hunched forward, retractions, pulse > 120      moderate 5. RECURRENT SYMPTOM: "Have you had difficulty breathing before?" If so, ask: "When was the last time?" and "What happened that time?"      No- patient is in so much pain that she is having SOB Patient is miserable- she was diagnosed with shingles last TH and she states her pain is so severe she can't breath. Patient is taking prednisone and antiviral. Patient states shingles is located L breast,arm and bach- she states she is blistered and pus is present. Hot to touch. Advised ED now.  Protocols used: BREATHING DIFFICULTY-A-AH

## 2018-08-17 NOTE — ED Notes (Signed)
Patient given discharge teaching and verbalized understanding. Patient was taken out of ED with wheelchair.   

## 2018-08-17 NOTE — Discharge Instructions (Signed)
Take Colace and miralax to help with constipation. Take Neurontin daily as prescribed. Take medication as prescribed to help with sleep. Discuss topical medications with your pharamcist.  Follow up with your doctor for further treatment.

## 2018-08-18 ENCOUNTER — Ambulatory Visit: Payer: Medicare Other | Admitting: Family Medicine

## 2018-08-18 NOTE — Telephone Encounter (Signed)
FYI-  patient was seen at the ER for the sob and chest pain. She was put on gabapentin 300 mg for the sob and the nerve pain. Patient stated she is doing a lot better with medication. Will discuss at next appt  if she should still get a shingle shot after she get better.

## 2018-08-18 NOTE — Telephone Encounter (Signed)
noted 

## 2018-09-15 ENCOUNTER — Other Ambulatory Visit: Payer: Self-pay

## 2018-09-15 DIAGNOSIS — I10 Essential (primary) hypertension: Secondary | ICD-10-CM

## 2018-09-15 DIAGNOSIS — C801 Malignant (primary) neoplasm, unspecified: Secondary | ICD-10-CM | POA: Insufficient documentation

## 2018-09-15 DIAGNOSIS — E785 Hyperlipidemia, unspecified: Secondary | ICD-10-CM

## 2018-09-22 ENCOUNTER — Other Ambulatory Visit: Payer: Self-pay

## 2018-09-22 ENCOUNTER — Telehealth (INDEPENDENT_AMBULATORY_CARE_PROVIDER_SITE_OTHER): Payer: Medicare HMO | Admitting: Family Medicine

## 2018-09-22 DIAGNOSIS — N183 Chronic kidney disease, stage 3 unspecified: Secondary | ICD-10-CM

## 2018-09-22 DIAGNOSIS — M25561 Pain in right knee: Secondary | ICD-10-CM | POA: Diagnosis not present

## 2018-09-22 DIAGNOSIS — I10 Essential (primary) hypertension: Secondary | ICD-10-CM

## 2018-09-22 DIAGNOSIS — M1711 Unilateral primary osteoarthritis, right knee: Secondary | ICD-10-CM

## 2018-09-22 DIAGNOSIS — G8929 Other chronic pain: Secondary | ICD-10-CM | POA: Diagnosis not present

## 2018-09-22 DIAGNOSIS — B0229 Other postherpetic nervous system involvement: Secondary | ICD-10-CM

## 2018-09-22 DIAGNOSIS — E785 Hyperlipidemia, unspecified: Secondary | ICD-10-CM | POA: Diagnosis not present

## 2018-09-22 MED ORDER — LISINOPRIL 40 MG PO TABS: 40.0000 mg | ORAL_TABLET | Freq: Every day | ORAL | 3 refills | Status: DC

## 2018-09-22 MED ORDER — HYDROCHLOROTHIAZIDE 25 MG PO TABS: 25.0000 mg | ORAL_TABLET | Freq: Every day | ORAL | 3 refills | Status: DC

## 2018-09-22 MED ORDER — TRAMADOL HCL 50 MG PO TABS: 50.0000 mg | ORAL_TABLET | Freq: Two times a day (BID) | ORAL | 5 refills | Status: DC

## 2018-09-22 NOTE — Progress Notes (Signed)
Virtual Visit via telephone Note  I connected with patient on 09/22/18 at 932 am by telephone and verified that I am speaking with the correct person using two identifiers. Gina Ball is currently located at home and patient is currently with her during visit. The provider, Rutherford Guys, MD is located in their office at time of visit.  I discussed the limitations, risks, security and privacy concerns of performing an evaluation and management service by telephone and the availability of in person appointments. I also discussed with the patient that there may be a patient responsible charge related to this service. The patient expressed understanding and agreed to proceed.   Telephone visit today for routine OV  HPI Last OV June 2019 Had shingles on Feb 2020, no new lesions, left flank and back Pain is about 4/10, not taking anything else from else  Checks BP at home Denies any chest pain, palpitations, dizziness, SOB  Takes tramadol prn for knee OA pain Currently a bit worse as not able to get injections pmp reveiwed  Lab Results  Component Value Date   CHOL 229 (H) 11/13/2017   HDL 70 11/13/2017   LDLCALC 111 (H) 11/13/2017   TRIG 239 (H) 11/13/2017   CHOLHDL 3.3 11/13/2017    Lab Results  Component Value Date   CREATININE 1.11 (H) 11/13/2017   BUN 26 11/13/2017   NA 147 (H) 11/13/2017   K 4.2 11/13/2017   CL 107 (H) 11/13/2017   CO2 24 11/13/2017   Fall Risk  09/22/2018 12/02/2017 11/13/2017  Falls in the past year? 0 No No  Number falls in past yr: 0 - -  Injury with Fall? 0 - -  Follow up Falls evaluation completed - -     Depression screen [facility] 2/9 09/22/2018 12/02/2017 11/13/2017  Decreased Interest 0 0 0  Down, Depressed, Hopeless 0 0 0  PHQ - 2 Score 0 0 0    No Known Allergies  Prior to Admission medications   Medication Sig Start Date End Date Taking? Authorizing Provider  aspirin EC 81 MG tablet Take 81 mg by mouth daily.   Yes [provider]  B Complex-Biotin-FA (B-100 B-COMPLEX) TABS Take by mouth.   Yes [provider]  Biotin 1 MG CAPS Take 1 capsule by mouth daily.   Yes [provider]  diphenhydrAMINE (BENADRYL ALLERGY) 25 mg capsule Take 25 mg by mouth daily as needed for allergies.   Yes [provider]  gabapentin (NEURONTIN) 100 MG capsule  08/13/18  Yes [provider]  hydrochlorothiazide (HYDRODIURIL) 25 MG tablet Take 1 tablet (25 mg total) by mouth daily. 12/02/17  Yes Rutherford Guys, MD  lisinopril (PRINIVIL,ZESTRIL) 40 MG tablet Take 1 tablet (40 mg total) by mouth daily. 12/02/17  Yes Rutherford Guys, MD  Multiple Vitamin (MULTIVITAMIN) tablet Take 1 tablet by mouth daily.   Yes [provider]  traMADol (ULTRAM) 50 MG tablet Take 1 tablet (50 mg total) by mouth 2 (two) times daily. 07/16/18  Yes Rutherford Guys, MD  gabapentin (NEURONTIN) 300 MG capsule Take 1 capsule (300 mg total) by mouth daily for 10 days. 08/17/18 08/27/18  Tacy Learn, PA-C  methocarbamol (ROBAXIN) 500 MG tablet Take 1 tablet (500 mg total) by mouth every 8 (eight) hours as needed for muscle spasms. Patient not taking: Reported on 12/11/2017 12/02/17   Rutherford Guys, MD  predniSONE (STERAPRED UNI-PAK 48 TAB) 10 MG (48) TBPK tablet  08/13/18  [provider]  valACYclovir (VALTREX) 1000 MG tablet  08/13/18   [provider]    Past Medical History:  Diagnosis Date  . Cancer ([place])    HAD BREST CANCER 1977  . Chronic pain of right knee   . H/O myocardial infarction, greater than 8 weeks 1986   reports normal NM Scan  . HTN (hypertension)   . Hyperlipidemia     Past Surgical History:  Procedure Laterality Date  . BREAST SURGERY     BILATERAL    Social History   Tobacco Use  . Smoking status: Never Smoker  . Smokeless tobacco: Never Used  Substance Use Topics  . Alcohol use: Yes    Comment: OCCASIONALLY    Family History  Problem Relation Age of  Onset  . Healthy Daughter   . Lymphoma Son     ROS   Per hpi  Objective  Vitals as reported by the patient: Today: 137/63, 87  There were no vitals filed for this visit.  ASSESSMENT and PLAN  1. Essential hypertension, benign 2. Chronic pain of right knee 3. Primary osteoarthritis of right knee 4. Hyperlipidemia, unspecified hyperlipidemia type 5. CKD (chronic kidney disease) stage 3, GFR 30-59 ml/min (HCC)  Chronic medical conditions are stable. Routine fasting labs ordered. Med refills.  6. Postherpetic neuralgia Resolving, overall doing well  Other orders - gabapentin (NEURONTIN) 100 MG capsule - traMADol (ULTRAM) 50 MG tablet; Take 1 tablet (50 mg total) by mouth 2 (two) times daily. - lisinopril (PRINIVIL,ZESTRIL) 40 MG tablet; Take 1 tablet (40 mg total) by mouth daily. - hydrochlorothiazide (HYDRODIURIL) 25 MG tablet; Take 1 tablet (25 mg total) by mouth daily.  FOLLOW-UP: this week fasting labs, followup with me in 6 months   The above assessment and management plan was discussed with the patient. The patient verbalized understanding of and has agreed to the management plan. Patient is aware to call the clinic if symptoms persist or worsen. Patient is aware when to return to the clinic for a follow-up visit. Patient educated on when it is appropriate to go to the emergency department.    I provided 17 minutes of non-face-to-face time during this encounter.  Rutherford Guys, MD Primary Care at [facility] [place], [place] [address] Ph.  [phone] Fax [phone]

## 2018-09-22 NOTE — Progress Notes (Signed)
Chief Complaint: pt's 6 mth f/u on medical conditions - pt states that she was dx with  shingles on Aug 13, 2018. Pt states that the "rash is doing better but still has some on her back, left side  and very little on her chest. Pt states that she went to the ER on Feb 25 due to the pain of the Shingles. Pt state that she do not need and medication refill at this time

## 2018-09-28 ENCOUNTER — Ambulatory Visit: Payer: Medicare HMO

## 2018-10-28 ENCOUNTER — Ambulatory Visit: Payer: Medicare HMO

## 2018-12-09 ENCOUNTER — Ambulatory Visit: Payer: Medicare HMO

## 2019-02-25 ENCOUNTER — Telehealth (INDEPENDENT_AMBULATORY_CARE_PROVIDER_SITE_OTHER): Payer: Medicare HMO | Admitting: Family Medicine

## 2019-02-25 ENCOUNTER — Other Ambulatory Visit: Payer: Self-pay

## 2019-02-25 VITALS — BP 128/79 | HR 123

## 2019-02-25 DIAGNOSIS — I10 Essential (primary) hypertension: Secondary | ICD-10-CM | POA: Diagnosis not present

## 2019-02-25 NOTE — Progress Notes (Signed)
Pt is following up on bp, she is not wanting to come into the office due to the virus. No refills needed at this time. No falls, anxiety or depression. Medication list updated

## 2019-02-25 NOTE — Progress Notes (Signed)
Virtual Visit Note  I connected with patient on 02/25/19 at 234pm by phone and verified that I am speaking with the correct person using two identifiers. Gina Ball is currently located at home and patient is currently with them during visit. The provider, Rutherford Guys, MD is located in their office at time of visit.  I discussed the limitations, risks, security and privacy concerns of performing an evaluation and management service by telephone and the availability of in person appointments. I also discussed with the patient that there may be a patient responsible charge related to this service. The patient expressed understanding and agreed to proceed.   CC: follow up  HPI ? Last OV March - telemedicine PMH: HTN, HLP, CKD3, R knee OA, postherpetic neuralgia  Checks BP at home Today it was 118/76, 104 Takes HCTZ and lisinopril daily wo any issues Denies any palpitations, chest pain, SOB, edema, cough, orthopnea, PND, fever or chills Denies any labs, not really leaving her house Walks 2 miles every day  Lab Results  Component Value Date   CREATININE 1.11 (H) 11/13/2017   BUN 26 11/13/2017   NA 147 (H) 11/13/2017   K 4.2 11/13/2017   CL 107 (H) 11/13/2017   CO2 24 11/13/2017   Lab Results  Component Value Date   CHOL 229 (H) 11/13/2017   HDL 70 11/13/2017   LDLCALC 111 (H) 11/13/2017   TRIG 239 (H) 11/13/2017   CHOLHDL 3.3 11/13/2017    No Known Allergies  Prior to Admission medications   Medication Sig Start Date End Date Taking? Authorizing Provider  aspirin EC 81 MG tablet Take 81 mg by mouth daily.   Yes [provider]  hydrochlorothiazide (HYDRODIURIL) 25 MG tablet Take 1 tablet (25 mg total) by mouth daily. 09/22/18  Yes Rutherford Guys, MD  lisinopril (PRINIVIL,ZESTRIL) 40 MG tablet Take 1 tablet (40 mg total) by mouth daily. 09/22/18  Yes Rutherford Guys, MD    Past Medical History:  Diagnosis Date  . Cancer ([place])    HAD BREST CANCER 1977   . Chronic pain of right knee   . H/O myocardial infarction, greater than 8 weeks 1986   reports normal NM Scan  . HTN (hypertension)   . Hyperlipidemia     Past Surgical History:  Procedure Laterality Date  . BREAST SURGERY     BILATERAL    Social History   Tobacco Use  . Smoking status: Never Smoker  . Smokeless tobacco: Never Used  Substance Use Topics  . Alcohol use: Yes    Comment: OCCASIONALLY    Family History  Problem Relation Age of Onset  . Healthy Daughter   . Lymphoma Son     ROS Per hpi  Objective  Vitals as reported by the patient: as above   ASSESSMENT and PLAN  1. Essential hypertension, benign Stable, continue current management Patient on minimal meds, stable CKD as last year, no sx of worsening kidney function, ok with postponing labs until next visit, sooner if any changes. RTC precautions reviewed.   FOLLOW-UP: 6 months   The above assessment and management plan was discussed with the patient. The patient verbalized understanding of and has agreed to the management plan. Patient is aware to call the clinic if symptoms persist or worsen. Patient is aware when to return to the clinic for a follow-up visit. Patient educated on when it is appropriate to go to the emergency department.    I provided 10 minutes  of non-face-to-face time during this encounter.  Rutherford Guys, MD Primary Care at [facility] [place], [place] [address] Ph.  [phone] Fax [phone]

## 2019-03-05 ENCOUNTER — Telehealth: Payer: Self-pay | Admitting: *Deleted

## 2019-03-05 NOTE — Telephone Encounter (Signed)
Schedule AWV.  

## 2019-03-17 ENCOUNTER — Telehealth: Payer: Self-pay | Admitting: *Deleted

## 2019-03-17 NOTE — Telephone Encounter (Signed)
Pt would like a cb for a awv. Please advise at [phone]

## 2019-03-18 ENCOUNTER — Telehealth: Payer: Self-pay | Admitting: Family Medicine

## 2019-03-18 NOTE — Telephone Encounter (Signed)
Please call patient she is stating she needs Rx refills on BP med, Water Pill, and Tramadol. Pt. Is stating pharmacy is saying refills expired.

## 2019-03-22 ENCOUNTER — Telehealth: Payer: Self-pay | Admitting: *Deleted

## 2019-03-22 ENCOUNTER — Ambulatory Visit (INDEPENDENT_AMBULATORY_CARE_PROVIDER_SITE_OTHER): Payer: Medicare HMO | Admitting: Family Medicine

## 2019-03-22 ENCOUNTER — Other Ambulatory Visit: Payer: Self-pay | Admitting: *Deleted

## 2019-03-22 VITALS — BP 128/79 | Ht 60.0 in | Wt 133.0 lb

## 2019-03-22 DIAGNOSIS — Z0000 Encounter for general adult medical examination without abnormal findings: Secondary | ICD-10-CM | POA: Diagnosis not present

## 2019-03-22 MED ORDER — HYDROCHLOROTHIAZIDE 25 MG PO TABS: 25.0000 mg | ORAL_TABLET | Freq: Every day | ORAL | 0 refills | Status: DC

## 2019-03-22 MED ORDER — TRAMADOL HCL 50 MG PO TABS: 50.0000 mg | ORAL_TABLET | Freq: Four times a day (QID) | ORAL | 2 refills | Status: DC | PRN

## 2019-03-22 MED ORDER — LISINOPRIL 40 MG PO TABS: 40.0000 mg | ORAL_TABLET | Freq: Every day | ORAL | 0 refills | Status: DC

## 2019-03-22 NOTE — Telephone Encounter (Signed)
pmp reviewed Med refilled, sent to [facility]

## 2019-03-22 NOTE — Telephone Encounter (Signed)
Patient states she needs a prescription for tramadol  She had a telemed visit 02-2019   Last time I can see tramadol was filled was 12-11-2018 patient states she is taking it for her knee pain.

## 2019-03-22 NOTE — Progress Notes (Addendum)
Presents today for [employer] Visit   Date of last exam: 02/25/2019  Interpreter used for this visit?  No  I connected with  Reino Kent on 03/22/19 by a telephone   and verified that I am speaking with the correct person using two identifiers.   I discussed the limitations of evaluation and management by telemedicine. The patient expressed understanding and agreed to proceed.    Patient Care Team: Rutherford Guys, MD as PCP - General (Family Medicine)   Other items to address today:   Discussed Immunizations Discussed Eye/Dental   Other Screening:  Last lipid screening 11/14/2018  ADVANCE DIRECTIVES: Discussed: yes On File: no Materials Provided:   Yes (mailed)  Immunization status:  Immunization History  Administered Date(s) Administered  . Influenza, Seasonal, Injecte, Preservative Fre 04/03/2015  . Influenza,inj,quad, With Preservative 03/05/2012  . Influenza-Unspecified 05/08/2005, 05/08/2006, 06/13/2008, 04/24/2009, 04/13/2010  . Pneumococcal Conjugate-13 04/03/2015  . Pneumococcal Polysaccharide-23 04/13/2010  . Tdap 04/18/2010     Health Maintenance Due  Topic Date Due  . Hepatitis C Screening  28-Mar-1946  . COLONOSCOPY  12/21/1994  . INFLUENZA VACCINE  01/23/2019     Functional Status Survey: Is the patient deaf or have difficulty hearing?: No Does the patient have difficulty seeing, even when wearing glasses/contacts?: No Does the patient have difficulty concentrating, remembering, or making decisions?: No Does the patient have difficulty walking or climbing stairs?: No Does the patient have difficulty dressing or bathing?: No Does the patient have difficulty doing errands alone such as visiting a doctor's office or shopping?: No   6CIT Screen 03/22/2019  What Year? 0 points  What month? 0 points  What time? 0 points  Count back from 20 0 points  Months in reverse 0 points  Repeat phrase 0 points  Total Score 0        Clinical Support from 03/22/2019 in [facility]  AUDIT-C Score  2       Home Environment:   Lives in two story home apartment built upstairs about daughter.  No trouble climbing stairs No scattered rugs No grab bars Adequate lighting/no clutter   Patient Active Problem List   Diagnosis Date Noted  . Cancer ([place]) 09/15/2018  . Hyperlipidemia 12/02/2017  . Primary osteoarthritis of right knee 12/02/2017  . Essential hypertension, benign 11/13/2017  . Chronic pain of right knee 11/13/2017  . CAD (coronary artery disease) 10/07/2016  . Glaucoma 11/13/2015  . Osteopenia 09/05/2015  . CKD (chronic kidney disease) stage 3, GFR 30-59 ml/min (HCC) 12/13/2014  . History of noncompliance with medical treatment, presenting hazards to health 09/15/2014  . Pain medication agreement 12/15/2013  . Allergic rhinitis 11/22/2011  . Hypertension goal BP (blood pressure) < 140/80 11/22/2011  . Hx of colonic polyp 11/22/2011  . Palpitation 11/22/2011     Past Medical History:  Diagnosis Date  . Cancer ([place])    HAD BREST CANCER 1977  . Chronic pain of right knee   . H/O myocardial infarction, greater than 8 weeks 1986   reports normal NM Scan  . HTN (hypertension)   . Hyperlipidemia      Past Surgical History:  Procedure Laterality Date  . BREAST SURGERY     BILATERAL     Family History  Problem Relation Age of Onset  . Healthy Daughter   . Lymphoma Son      Social History   Socioeconomic History  . Marital status: Widowed    Spouse  name: Not on file  . Number of children: Not on file  . Years of education: Not on file  . Highest education level: Not on file  Occupational History  . Not on file  Social Needs  . Financial resource strain: Not on file  . Food insecurity    Worry: Not on file    Inability: Not on file  . Transportation needs    Medical: Not on file    Non-medical: Not on file  Tobacco Use  . Smoking status: Never Smoker  . Smokeless  tobacco: Never Used  Substance and Sexual Activity  . Alcohol use: Yes    Comment: OCCASIONALLY  . Drug use: Not Currently  . Sexual activity: Not Currently  Lifestyle  . Physical activity    Days per week: Not on file    Minutes per session: Not on file  . Stress: Not on file  Relationships  . Social Herbalist on phone: Not on file    Gets together: Not on file    Attends religious service: Not on file    Active member of club or organization: Not on file    Attends meetings of clubs or organizations: Not on file    Relationship status: Not on file  . Intimate partner violence    Fear of current or ex partner: Not on file    Emotionally abused: Not on file    Physically abused: Not on file    Forced sexual activity: Not on file  Other Topics Concern  . Not on file  Social History Narrative  . Not on file     No Known Allergies   Prior to Admission medications   Medication Sig Start Date End Date Taking? Authorizing Provider  aspirin EC 81 MG tablet Take 81 mg by mouth daily.   Yes [provider]  hydrochlorothiazide (HYDRODIURIL) 25 MG tablet Take 1 tablet (25 mg total) by mouth daily. 09/22/18  Yes Rutherford Guys, MD  lisinopril (PRINIVIL,ZESTRIL) 40 MG tablet Take 1 tablet (40 mg total) by mouth daily. 09/22/18  Yes Rutherford Guys, MD  traMADol (ULTRAM) 50 MG tablet Take 50 mg by mouth every 6 (six) hours as needed.   Yes [provider]     Depression screen [facility] 2/9 03/22/2019 02/25/2019 09/22/2018 12/02/2017 11/13/2017  Decreased Interest 0 0 0 0 0  Down, Depressed, Hopeless 0 0 0 0 0  PHQ - 2 Score 0 0 0 0 0     Fall Risk  03/22/2019 02/25/2019 09/22/2018 12/02/2017 11/13/2017  Falls in the past year? 0 0 0 No No  Number falls in past yr: 0 0 0 - -  Injury with Fall? 0 0 0 - -  Follow up Falls evaluation completed;Education provided;Falls prevention discussed - Falls evaluation completed - -      PHYSICAL EXAM: BP 128/79 Comment:  taken from previous visit  Ht 5' (1.524 m)   Wt 133 lb (60.3 kg) Comment: previous taken  BMI 25.97 kg/m    Wt Readings from Last 3 Encounters:  03/22/19 133 lb (60.3 kg)  08/17/18 128 lb (58.1 kg)  12/11/17 131 lb (59.4 kg)     Medicare annual wellness visit, subsequent    Physical Exam   Education/Counseling provided regarding diet and exercise, prevention of chronic diseases, smoking/tobacco cessation, if applicable, and reviewed "Covered Medicare Preventive Services."

## 2019-03-22 NOTE — Addendum Note (Signed)
Addended by: Rutherford Guys on: 03/22/2019 06:15 PM   Modules accepted: Orders

## 2019-03-22 NOTE — Patient Instructions (Signed)
Thank you for taking time to come for your Medicare Wellness Visit. I appreciate your ongoing commitment to your health goals. Please review the following plan we discussed and let me know if I can assist you in the future.  Jamaal Bernasconi LPN   Preventive Care 74 Years and Older, Female Preventive care refers to lifestyle choices and visits with your health care provider that can promote health and wellness. This includes:  A yearly physical exam. This is also called an annual well check.  Regular dental and eye exams.  Immunizations.  Screening for certain conditions.  Healthy lifestyle choices, such as diet and exercise. What can I expect for my preventive care visit? Physical exam Your health care provider will check:  Height and weight. These may be used to calculate body mass index (BMI), which is a measurement that tells if you are at a healthy weight.  Heart rate and blood pressure.  Your skin for abnormal spots. Counseling Your health care provider may ask you questions about:  Alcohol, tobacco, and drug use.  Emotional well-being.  Home and relationship well-being.  Sexual activity.  Eating habits.  History of falls.  Memory and ability to understand (cognition).  Work and work environment.  Pregnancy and menstrual history. What immunizations do I need?  Influenza (flu) vaccine  This is recommended every year. Tetanus, diphtheria, and pertussis (Tdap) vaccine  You may need a Td booster every 10 years. Varicella (chickenpox) vaccine  You may need this vaccine if you have not already been vaccinated. Zoster (shingles) vaccine  You may need this after age 60. Pneumococcal conjugate (PCV13) vaccine  One dose is recommended after age 65. Pneumococcal polysaccharide (PPSV23) vaccine  One dose is recommended after age 65. Measles, mumps, and rubella (MMR) vaccine  You may need at least one dose of MMR if you were born in 1957 or later. You may also  need a second dose. Meningococcal conjugate (MenACWY) vaccine  You may need this if you have certain conditions. Hepatitis A vaccine  You may need this if you have certain conditions or if you travel or work in places where you may be exposed to hepatitis A. Hepatitis B vaccine  You may need this if you have certain conditions or if you travel or work in places where you may be exposed to hepatitis B. Haemophilus influenzae type b (Hib) vaccine  You may need this if you have certain conditions. You may receive vaccines as individual doses or as more than one vaccine together in one shot (combination vaccines). Talk with your health care provider about the risks and benefits of combination vaccines. What tests do I need? Blood tests  Lipid and cholesterol levels. These may be checked every 5 years, or more frequently depending on your overall health.  Hepatitis C test.  Hepatitis B test. Screening  Lung cancer screening. You may have this screening every year starting at age 55 if you have a 30-pack-year history of smoking and currently smoke or have quit within the past 15 years.  Colorectal cancer screening. All adults should have this screening starting at age 50 and continuing until age 75. Your health care provider may recommend screening at age 45 if you are at increased risk. You will have tests every 1-10 years, depending on your results and the type of screening test.  Diabetes screening. This is done by checking your blood sugar (glucose) after you have not eaten for a while (fasting). You may have this done every   1-3 years.  Mammogram. This may be done every 1-2 years. Talk with your health care provider about how often you should have regular mammograms.  BRCA-related cancer screening. This may be done if you have a family history of breast, ovarian, tubal, or peritoneal cancers. Other tests  Sexually transmitted disease (STD) testing.  Bone density scan. This is done  to screen for osteoporosis. You may have this done starting at age 65. Follow these instructions at home: Eating and drinking  Eat a diet that includes fresh fruits and vegetables, whole grains, lean protein, and low-fat dairy products. Limit your intake of foods with high amounts of sugar, saturated fats, and salt.  Take vitamin and mineral supplements as recommended by your health care provider.  Do not drink alcohol if your health care provider tells you not to drink.  If you drink alcohol: ? Limit how much you have to 0-1 drink a day. ? Be aware of how much alcohol is in your drink. In the [place] one drink equals one 12 oz bottle of beer (355 mL), one 5 oz glass of wine (148 mL), or one 1 oz glass of hard liquor (44 mL). Lifestyle  Take daily care of your teeth and gums.  Stay active. Exercise for at least 30 minutes on 5 or more days each week.  Do not use any products that contain nicotine or tobacco, such as cigarettes, e-cigarettes, and chewing tobacco. If you need help quitting, ask your health care provider.  If you are sexually active, practice safe sex. Use a condom or other form of protection in order to prevent STIs (sexually transmitted infections).  Talk with your health care provider about taking a low-dose aspirin or statin. What's next?  Go to your health care provider once a year for a well check visit.  Ask your health care provider how often you should have your eyes and teeth checked.  Stay up to date on all vaccines. This information is not intended to replace advice given to you by your health care provider. Make sure you discuss any questions you have with your health care provider. Document Released: 07/07/2015 Document Revised: 06/04/2018 Document Reviewed: 06/04/2018 Elsevier Patient Education  2020 Else[employer] Inc[employer]  

## 2019-07-29 ENCOUNTER — Other Ambulatory Visit: Payer: Self-pay | Admitting: Family Medicine

## 2019-07-29 MED ORDER — LISINOPRIL 40 MG PO TABS: 40.0000 mg | ORAL_TABLET | Freq: Every day | ORAL | 0 refills | Status: DC

## 2019-07-29 MED ORDER — HYDROCHLOROTHIAZIDE 25 MG PO TABS: 25.0000 mg | ORAL_TABLET | Freq: Every day | ORAL | 0 refills | Status: DC

## 2019-07-29 NOTE — Telephone Encounter (Signed)
Requested medication (s) are due for refill today: yes  Requested medication (s) are on the active medication list: yes  Last refill: 03/22/2019  Future visit scheduled: no  Notes to clinic:  not delegated    Requested Prescriptions  Pending Prescriptions Disp Refills   traMADol (ULTRAM) 50 MG tablet 60 tablet 2    Sig: Take 1 tablet (50 mg total) by mouth every 6 (six) hours as needed.      Not Delegated - Analgesics:  Opioid Agonists Failed - 07/29/2019  1:00 PM      Failed - This refill cannot be delegated      Failed - Urine Drug Screen completed in last 360 days.      Passed - Valid encounter within last 6 months    Recent Outpatient Visits           4 months ago Medicare annual wellness visit, subsequent   [facility], Lilia Argue, MD   5 months ago Essential hypertension, benign   [facility], Lilia Argue, MD   10 months ago Essential hypertension, benign   [facility], Lilia Argue, MD   1 year ago Primary osteoarthritis of right knee   [facility], Lilia Argue, MD   1 year ago Essential hypertension, benign   [facility], Lilia Argue, MD               Signed Prescriptions Disp Refills   hydrochlorothiazide (HYDRODIURIL) 25 MG tablet 90 tablet 0    Sig: Take 1 tablet (25 mg total) by mouth daily.      Cardiovascular: Diuretics - Thiazide Failed - 07/29/2019  1:00 PM      Failed - Ca in normal range and within 360 days    Calcium  Date Value Ref Range Status  11/13/2017 9.3 8.7 - 10.3 mg/dL Final          Failed - Cr in normal range and within 360 days    Creatinine, Ser  Date Value Ref Range Status  11/13/2017 1.11 (H) 0.57 - 1.00 mg/dL Final          Failed - K in normal range and within 360 days    Potassium  Date Value Ref Range Status  11/13/2017 4.2 3.5 - 5.2 mmol/L Final          Failed - Na in normal range and within 360 days    Sodium  Date Value Ref Range  Status  11/13/2017 147 (H) 134 - 144 mmol/L Final          Passed - Last BP in normal range    BP Readings from Last 1 Encounters:  03/22/19 128/79          Passed - Valid encounter within last 6 months    Recent Outpatient Visits           4 months ago Medicare annual wellness visit, subsequent   [facility], Lilia Argue, MD   5 months ago Essential hypertension, benign   [facility], Lilia Argue, MD   10 months ago Essential hypertension, benign   [facility], Lilia Argue, MD   1 year ago Primary osteoarthritis of right knee   [facility], Lilia Argue, MD   1 year ago Essential hypertension, benign   [facility], Lilia Argue, MD  lisinopril (ZESTRIL) 40 MG tablet 90 tablet 0    Sig: Take 1 tablet (40 mg total) by mouth daily.      Cardiovascular:  ACE Inhibitors Failed - 07/29/2019  1:00 PM      Failed - Cr in normal range and within 180 days    Creatinine, Ser  Date Value Ref Range Status  11/13/2017 1.11 (H) 0.57 - 1.00 mg/dL Final          Failed - K in normal range and within 180 days    Potassium  Date Value Ref Range Status  11/13/2017 4.2 3.5 - 5.2 mmol/L Final          Passed - Patient is not pregnant      Passed - Last BP in normal range    BP Readings from Last 1 Encounters:  03/22/19 128/79          Passed - Valid encounter within last 6 months    Recent Outpatient Visits           4 months ago Medicare annual wellness visit, subsequent   [facility], Lilia Argue, MD   5 months ago Essential hypertension, benign   [facility], Lilia Argue, MD   10 months ago Essential hypertension, benign   [facility], Lilia Argue, MD   1 year ago Primary osteoarthritis of right knee   [facility], Lilia Argue, MD   1 year ago Essential hypertension, benign   [facility], Lilia Argue, MD

## 2019-07-29 NOTE — Telephone Encounter (Signed)
Requested Prescriptions  Pending Prescriptions Disp Refills  . hydrochlorothiazide (HYDRODIURIL) 25 MG tablet 90 tablet 0    Sig: Take 1 tablet (25 mg total) by mouth daily.     Cardiovascular: Diuretics - Thiazide Failed - 07/29/2019  1:00 PM      Failed - Ca in normal range and within 360 days    Calcium  Date Value Ref Range Status  11/13/2017 9.3 8.7 - 10.3 mg/dL Final         Failed - Cr in normal range and within 360 days    Creatinine, Ser  Date Value Ref Range Status  11/13/2017 1.11 (H) 0.57 - 1.00 mg/dL Final         Failed - K in normal range and within 360 days    Potassium  Date Value Ref Range Status  11/13/2017 4.2 3.5 - 5.2 mmol/L Final         Failed - Na in normal range and within 360 days    Sodium  Date Value Ref Range Status  11/13/2017 147 (H) 134 - 144 mmol/L Final         Passed - Last BP in normal range    BP Readings from Last 1 Encounters:  03/22/19 128/79         Passed - Valid encounter within last 6 months    Recent Outpatient Visits          4 months ago Medicare annual wellness visit, subsequent   [facility], Lilia Argue, MD   5 months ago Essential hypertension, benign   [facility], Lilia Argue, MD   10 months ago Essential hypertension, benign   [facility], Lilia Argue, MD   1 year ago Primary osteoarthritis of right knee   [facility], Lilia Argue, MD   1 year ago Essential hypertension, benign   [facility], Lilia Argue, MD             . lisinopril (ZESTRIL) 40 MG tablet 90 tablet 0    Sig: Take 1 tablet (40 mg total) by mouth daily.     Cardiovascular:  ACE Inhibitors Failed - 07/29/2019  1:00 PM      Failed - Cr in normal range and within 180 days    Creatinine, Ser  Date Value Ref Range Status  11/13/2017 1.11 (H) 0.57 - 1.00 mg/dL Final         Failed - K in normal range and within 180 days    Potassium  Date Value Ref Range Status   11/13/2017 4.2 3.5 - 5.2 mmol/L Final         Passed - Patient is not pregnant      Passed - Last BP in normal range    BP Readings from Last 1 Encounters:  03/22/19 128/79         Passed - Valid encounter within last 6 months    Recent Outpatient Visits          4 months ago Medicare annual wellness visit, subsequent   [facility], Lilia Argue, MD   5 months ago Essential hypertension, benign   [facility], Lilia Argue, MD   10 months ago Essential hypertension, benign   [facility], Lilia Argue, MD   1 year ago Primary osteoarthritis of right knee   [facility], Irma M,  MD   1 year ago Essential hypertension, benign   [facility], Lilia Argue, MD             . traMADol (ULTRAM) 50 MG tablet 60 tablet 2    Sig: Take 1 tablet (50 mg total) by mouth every 6 (six) hours as needed.     Not Delegated - Analgesics:  Opioid Agonists Failed - 07/29/2019  1:00 PM      Failed - This refill cannot be delegated      Failed - Urine Drug Screen completed in last 360 days.      Passed - Valid encounter within last 6 months    Recent Outpatient Visits          4 months ago Medicare annual wellness visit, subsequent   [facility], Lilia Argue, MD   5 months ago Essential hypertension, benign   [facility], Lilia Argue, MD   10 months ago Essential hypertension, benign   [facility], Lilia Argue, MD   1 year ago Primary osteoarthritis of right knee   [facility], Lilia Argue, MD   1 year ago Essential hypertension, benign   [facility], Lilia Argue, MD

## 2019-07-29 NOTE — Telephone Encounter (Signed)
Medication Refill - Medication: lisinopril (ZESTRIL) 40 MG tablet [id number]   hydrochlorothiazide (HYDRODIURIL) 25 MG tablet [id number]   traMADol (ULTRAM) 50 MG tablet [id number]  Preferred Pharmacy (with phone number or street name):  [facility]  [place] [address]  Phone: [phone] Fax: [phone]    Agent: Please be advised that RX refills may take up to 3 business days. We ask that you follow-up with your pharmacy.

## 2019-07-29 NOTE — Telephone Encounter (Signed)
Please advise 

## 2019-07-30 MED ORDER — TRAMADOL HCL 50 MG PO TABS: 50.0000 mg | ORAL_TABLET | Freq: Four times a day (QID) | ORAL | 2 refills | Status: DC | PRN

## 2019-07-30 NOTE — Telephone Encounter (Signed)
pmp reviewd, appropriate meds refilled 

## 2019-08-27 ENCOUNTER — Ambulatory Visit: Payer: Medicare HMO

## 2019-09-06 ENCOUNTER — Telehealth: Payer: Self-pay

## 2019-09-06 NOTE — Telephone Encounter (Signed)
I have attempted to call pt back and there was no answer. Unable to leave voicemail.  If the question is about getting the Vaccine it is recommended per provider.  Thanks

## 2019-09-09 ENCOUNTER — Ambulatory Visit: Payer: Medicare HMO

## 2020-01-12 ENCOUNTER — Other Ambulatory Visit: Payer: Self-pay | Admitting: Family Medicine

## 2020-02-07 ENCOUNTER — Other Ambulatory Visit: Payer: Self-pay | Admitting: Family Medicine

## 2020-02-07 MED ORDER — HYDROCHLOROTHIAZIDE 25 MG PO TABS: 25.0000 mg | ORAL_TABLET | Freq: Every day | ORAL | 0 refills | Status: DC

## 2020-02-07 MED ORDER — LISINOPRIL 40 MG PO TABS: 40.0000 mg | ORAL_TABLET | Freq: Every day | ORAL | 0 refills | Status: DC

## 2020-02-07 NOTE — Telephone Encounter (Signed)
Requested medication (s) are due for refill today - unsure  Requested medication (s) are on the active medication list -yes  Future visit scheduled -yes  Last refill: 07/30/19 #60 2 RF  Notes to clinic: request for non delegated Rx  Requested Prescriptions  Pending Prescriptions Disp Refills   traMADol (ULTRAM) 50 MG tablet 60 tablet 2    Sig: Take 1 tablet (50 mg total) by mouth every 6 (six) hours as needed.      Not Delegated - Analgesics:  Opioid Agonists Failed - 02/07/2020  2:26 PM      Failed - This refill cannot be delegated      Failed - Urine Drug Screen completed in last 360 days.      Failed - Valid encounter within last 6 months    Recent Outpatient Visits           10 months ago Medicare annual wellness visit, subsequent   [facility], Lilia Argue, MD   11 months ago Essential hypertension, benign   [facility], Lilia Argue, MD   1 year ago Essential hypertension, benign   [facility], Lilia Argue, MD   2 years ago Primary osteoarthritis of right knee   [facility], Lilia Argue, MD   2 years ago Essential hypertension, benign   [facility], Lilia Argue, MD       Future Appointments             In 1 week Rutherford Guys, MD Primary Care at [place], [facility]             Signed Prescriptions Disp Refills   hydrochlorothiazide (HYDRODIURIL) 25 MG tablet 30 tablet 0    Sig: Take 1 tablet (25 mg total) by mouth daily.      Cardiovascular: Diuretics - Thiazide Failed - 02/07/2020  2:26 PM      Failed - Ca in normal range and within 360 days    Calcium  Date Value Ref Range Status  11/13/2017 9.3 8.7 - 10.3 mg/dL Final          Failed - Cr in normal range and within 360 days    Creatinine, Ser  Date Value Ref Range Status  11/13/2017 1.11 (H) 0.57 - 1.00 mg/dL Final          Failed - K in normal range and within 360 days    Potassium  Date Value Ref Range Status  11/13/2017  4.2 3.5 - 5.2 mmol/L Final          Failed - Na in normal range and within 360 days    Sodium  Date Value Ref Range Status  11/13/2017 147 (H) 134 - 144 mmol/L Final          Failed - Valid encounter within last 6 months    Recent Outpatient Visits           10 months ago Medicare annual wellness visit, subsequent   [facility], Lilia Argue, MD   11 months ago Essential hypertension, benign   [facility], Lilia Argue, MD   1 year ago Essential hypertension, benign   [facility], Lilia Argue, MD   2 years ago Primary osteoarthritis of right knee   [facility], Lilia Argue, MD   2 years ago Essential hypertension, benign   Primary Care at [facility],  Lilia Argue, MD       Future Appointments             In 1 week Rutherford Guys, MD Primary Care at [facility], [place] BP in normal range    BP Readings from Last 1 Encounters:  03/22/19 128/79            lisinopril (ZESTRIL) 40 MG tablet 30 tablet 0    Sig: Take 1 tablet (40 mg total) by mouth daily.      Cardiovascular:  ACE Inhibitors Failed - 02/07/2020  2:26 PM      Failed - Cr in normal range and within 180 days    Creatinine, Ser  Date Value Ref Range Status  11/13/2017 1.11 (H) 0.57 - 1.00 mg/dL Final          Failed - K in normal range and within 180 days    Potassium  Date Value Ref Range Status  11/13/2017 4.2 3.5 - 5.2 mmol/L Final          Failed - Valid encounter within last 6 months    Recent Outpatient Visits           10 months ago Medicare annual wellness visit, subsequent   [facility], Lilia Argue, MD   11 months ago Essential hypertension, benign   [facility], Lilia Argue, MD   1 year ago Essential hypertension, benign   [facility], Lilia Argue, MD   2 years ago Primary osteoarthritis of right knee   [facility], Lilia Argue, MD   2 years  ago Essential hypertension, benign   [facility] at [facility], Lilia Argue, MD       Future Appointments             In 1 week Rutherford Guys, MD Primary Care at [place], [place] - Patient is not pregnant      Passed - Last BP in normal range    BP Readings from Last 1 Encounters:  03/22/19 128/79              Requested Prescriptions  Pending Prescriptions Disp Refills   traMADol (ULTRAM) 50 MG tablet 60 tablet 2    Sig: Take 1 tablet (50 mg total) by mouth every 6 (six) hours as needed.      Not Delegated - Analgesics:  Opioid Agonists Failed - 02/07/2020  2:26 PM      Failed - This refill cannot be delegated      Failed - Urine Drug Screen completed in last 360 days.      Failed - Valid encounter within last 6 months    Recent Outpatient Visits           10 months ago Medicare annual wellness visit, subsequent   [facility] at [facility], Lilia Argue, MD   11 months ago Essential hypertension, benign   [facility] at [facility], Lilia Argue, MD   1 year ago Essential hypertension, benign   [facility] at [facility], Lilia Argue, MD   2 years ago Primary osteoarthritis of right knee   [facility] at [facility], Lilia Argue, MD   2 years ago Essential hypertension, benign   [facility] at [facility], Lilia Argue, MD  Future Appointments             In 1 week Rutherford Guys, MD Primary Care at [place], [facility]             Signed Prescriptions Disp Refills   hydrochlorothiazide (HYDRODIURIL) 25 MG tablet 30 tablet 0    Sig: Take 1 tablet (25 mg total) by mouth daily.      Cardiovascular: Diuretics - Thiazide Failed - 02/07/2020  2:26 PM      Failed - Ca in normal range and within 360 days    Calcium  Date Value Ref Range Status  11/13/2017 9.3 8.7 - 10.3 mg/dL Final          Failed - Cr in normal range and within 360 days    Creatinine, Ser  Date Value Ref Range Status  11/13/2017 1.11 (H) 0.57 - 1.00 mg/dL Final           Failed - K in normal range and within 360 days    Potassium  Date Value Ref Range Status  11/13/2017 4.2 3.5 - 5.2 mmol/L Final          Failed - Na in normal range and within 360 days    Sodium  Date Value Ref Range Status  11/13/2017 147 (H) 134 - 144 mmol/L Final          Failed - Valid encounter within last 6 months    Recent Outpatient Visits           10 months ago Medicare annual wellness visit, subsequent   [facility] at [facility], Lilia Argue, MD   11 months ago Essential hypertension, benign   [facility] at [facility], Lilia Argue, MD   1 year ago Essential hypertension, benign   [facility], Lilia Argue, MD   2 years ago Primary osteoarthritis of right knee   [facility] at [facility], Lilia Argue, MD   2 years ago Essential hypertension, benign   [facility] at [facility], Lilia Argue, MD       Future Appointments             In 1 week Rutherford Guys, MD Primary Care at [facility], [place] BP in normal range    BP Readings from Last 1 Encounters:  03/22/19 128/79            lisinopril (ZESTRIL) 40 MG tablet 30 tablet 0    Sig: Take 1 tablet (40 mg total) by mouth daily.      Cardiovascular:  ACE Inhibitors Failed - 02/07/2020  2:26 PM      Failed - Cr in normal range and within 180 days    Creatinine, Ser  Date Value Ref Range Status  11/13/2017 1.11 (H) 0.57 - 1.00 mg/dL Final          Failed - K in normal range and within 180 days    Potassium  Date Value Ref Range Status  11/13/2017 4.2 3.5 - 5.2 mmol/L Final          Failed - Valid encounter within last 6 months    Recent Outpatient Visits           10 months ago Medicare annual wellness visit, subsequent   [facility] at [facility], Lilia Argue, MD   11 months ago Essential hypertension, benign   Primary Care at [facility],  Lilia Argue, MD   1 year ago Essential hypertension, benign   [facility] at [facility], Lilia Argue,  MD   2 years ago Primary osteoarthritis of right knee   [facility] at [facility], Lilia Argue, MD   2 years ago Essential hypertension, benign   [facility] at [facility], Lilia Argue, MD       Future Appointments             In 1 week Rutherford Guys, MD Primary Care at [place], [place] - Patient is not pregnant      Passed - Last BP in normal range    BP Readings from Last 1 Encounters:  03/22/19 128/79

## 2020-02-07 NOTE — Telephone Encounter (Signed)
Patient has appointment 02/15/20- courtesy RF given#30

## 2020-02-07 NOTE — Telephone Encounter (Signed)
Medication Refill - Medication: lisinopril (ZESTRIL) 40 MG tablet  hydrochlorothiazide (HYDRODIURIL) 25 MG tablet  traMADol (ULTRAM) 50 MG tablet   Has the patient contacted their pharmacy? Yes.   (Agent: If no, request that the patient contact the pharmacy for the refill.) (Agent: If yes, when and what did the pharmacy advise?)  Preferred Pharmacy (with phone number or street name):  [facility], [place]  [place] [address]  Phone: [phone] Fax: [phone]     Agent: Please be advised that RX refills may take up to 3 business days. We ask that you follow-up with your pharmacy.

## 2020-02-07 NOTE — Telephone Encounter (Signed)
Patient is requesting a refill of the following medications: Requested Prescriptions   Pending Prescriptions Disp Refills   traMADol (ULTRAM) 50 MG tablet 60 tablet 2    Sig: Take 1 tablet (50 mg total) by mouth every 6 (six) hours as needed.   Signed Prescriptions Disp Refills   hydrochlorothiazide (HYDRODIURIL) 25 MG tablet 30 tablet 0    Sig: Take 1 tablet (25 mg total) by mouth daily.    Authorizing Provider: Pamella Pert, [place]    Ordering User: Romana Juniper   lisinopril (ZESTRIL) 40 MG tablet 30 tablet 0    Sig: Take 1 tablet (40 mg total) by mouth daily.    Authorizing Provider: Pamella Pert, [place] M    Ordering User: Valli Glance F    Date of patient request: 02/07/20 Last office visit: 07/30/19 Date of last refill: 02/15/20 Last refill amount: #60 2 RF Follow up time period per chart: 02/15/20

## 2020-02-08 MED ORDER — TRAMADOL HCL 50 MG PO TABS: 50.0000 mg | ORAL_TABLET | Freq: Four times a day (QID) | ORAL | 2 refills | Status: DC | PRN

## 2020-02-08 NOTE — Telephone Encounter (Signed)
pmp reviewd, appropriate meds refilled 

## 2020-02-10 ENCOUNTER — Other Ambulatory Visit: Payer: Self-pay | Admitting: Family Medicine

## 2020-02-15 ENCOUNTER — Ambulatory Visit: Payer: Medicare HMO | Admitting: Family Medicine

## 2020-02-24 ENCOUNTER — Ambulatory Visit (INDEPENDENT_AMBULATORY_CARE_PROVIDER_SITE_OTHER): Payer: Medicare HMO

## 2020-02-24 ENCOUNTER — Other Ambulatory Visit: Payer: Self-pay

## 2020-02-24 ENCOUNTER — Ambulatory Visit (INDEPENDENT_AMBULATORY_CARE_PROVIDER_SITE_OTHER): Payer: Medicare HMO | Admitting: Family Medicine

## 2020-02-24 ENCOUNTER — Encounter: Payer: Self-pay | Admitting: Family Medicine

## 2020-02-24 VITALS — BP 140/84 | HR 96 | Temp 98.1°F | Ht 60.0 in | Wt 146.2 lb

## 2020-02-24 DIAGNOSIS — M79605 Pain in left leg: Secondary | ICD-10-CM | POA: Diagnosis not present

## 2020-02-24 DIAGNOSIS — M25462 Effusion, left knee: Secondary | ICD-10-CM | POA: Diagnosis not present

## 2020-02-24 DIAGNOSIS — Z0000 Encounter for general adult medical examination without abnormal findings: Secondary | ICD-10-CM

## 2020-02-24 DIAGNOSIS — M25562 Pain in left knee: Secondary | ICD-10-CM

## 2020-02-24 NOTE — Patient Instructions (Signed)
° ° ° °  If you have lab work done today you will be contacted with your lab results within the next 2 weeks.  If you have not heard from us then please contact us. The fastest way to get your results is to register for My Chart. ° ° °IF you received an x-ray today, you will receive an invoice from [facility]. Please contact [facility] at [phone] with questions or concerns regarding your invoice.  ° °IF you received labwork today, you will receive an invoice from [facility]. Please contact [facility] at [phone] with questions or concerns regarding your invoice.  ° °Our billing staff will not be able to assist you with questions regarding bills from these companies. ° °You will be contacted with the lab results as soon as they are available. The fastest way to get your results is to activate your My Chart account. Instructions are located on the last page of this paperwork. If you have not heard from us regarding the results in 2 weeks, please contact this office. °  ° ° ° °

## 2020-02-24 NOTE — Progress Notes (Signed)
9/2/20214:23 PM  Gina Ball 1946-02-24, 74 y.o., female [id number]  Chief Complaint  Patient presents with  . Pain    left leg and knee pain and cramping. Has developed a dent on the side of the left leg due to the cramping    HPI:   Patient is a 74 y.o. female  who presents today for left knee/leg pain  She has been having issues for past several months She noticed swollen muscle with indentation like a stuck cramp along outer side of calf towards the knee Then her knee started hurting and has been having some swelling as well She has continued to walk on this but pain has been getting worse She denies any falls, injuries, abrupt moves   Depression screen [facility] 2/9 03/22/2019 02/25/2019 09/22/2018  Decreased Interest 0 0 0  Down, Depressed, Hopeless 0 0 0  PHQ - 2 Score 0 0 0    Fall Risk  03/22/2019 02/25/2019 09/22/2018 12/02/2017 11/13/2017  Falls in the past year? 0 0 0 No No  Number falls in past yr: 0 0 0 - -  Injury with Fall? 0 0 0 - -  Follow up Falls evaluation completed;Education provided;Falls prevention discussed - Falls evaluation completed - -     No Known Allergies  Prior to Admission medications   Medication Sig Start Date End Date Taking? Authorizing Provider  aspirin EC 81 MG tablet Take 81 mg by mouth daily.   Yes [provider]  hydrochlorothiazide (HYDRODIURIL) 25 MG tablet Take 1 tablet (25 mg total) by mouth daily. 02/07/20  Yes Rutherford Guys, MD  lisinopril (ZESTRIL) 40 MG tablet Take 1 tablet (40 mg total) by mouth daily. 02/07/20  Yes Rutherford Guys, MD  traMADol (ULTRAM) 50 MG tablet Take 1 tablet (50 mg total) by mouth every 6 (six) hours as needed. 02/08/20  Yes Rutherford Guys, MD    Past Medical History:  Diagnosis Date  . Cancer ([place])    HAD BREST CANCER 1977  . Chronic pain of right knee   . H/O myocardial infarction, greater than 8 weeks 1986   reports normal NM Scan  . HTN (hypertension)   . Hyperlipidemia     Past  Surgical History:  Procedure Laterality Date  . BREAST SURGERY     BILATERAL    Social History   Tobacco Use  . Smoking status: Never Smoker  . Smokeless tobacco: Never Used  Substance Use Topics  . Alcohol use: Yes    Comment: OCCASIONALLY    Family History  Problem Relation Age of Onset  . Healthy Daughter   . Lymphoma Son     ROS Per hpi  OBJECTIVE:  [id number]   02/24/20 1549  BP: 140/84  Pulse: 96  Temp: 98.1 F (36.7 C)  SpO2: 96%  Weight: 146 lb 3.2 oz (66.3 kg)  Height: 5' (1.524 m)   Body mass index is 28.55 kg/m.   Physical Exam Vitals and nursing note reviewed.  Constitutional:      Appearance: She is well-developed.  HENT:     Head: Normocephalic and atraumatic.  Eyes:     General: No scleral icterus.    Conjunctiva/sclera: Conjunctivae normal.     Pupils: Pupils are equal, round, and reactive to light.  Pulmonary:     Effort: Pulmonary effort is normal.  Musculoskeletal:     Cervical back: Neck supple.     Left knee: Swelling and effusion present. No ecchymosis or bony  tenderness. Tenderness present over the patellar tendon.     Left lower leg: Swelling and tenderness present.  Skin:    General: Skin is warm and dry.  Neurological:     Mental Status: She is alert and oriented to person, place, and time.        No results found for this or any previous visit (from the past 24 hour(s)).  DG Knee 1-2 Views Left  Result Date: 02/24/2020 CLINICAL DATA:  Swelling and pain EXAM: LEFT KNEE - 1-2 VIEW COMPARISON:  02/24/2020 FINDINGS: No fracture or malalignment. Mild tricompartment arthritis of the knee. Trace knee effusion. IMPRESSION: Mild tricompartment arthritis of the knee with trace knee effusion. Electronically Signed   By: Donavan Foil M.D.   On: 02/24/2020 16:14   DG Tibia/Fibula Left  Result Date: 02/24/2020 CLINICAL DATA:  Leg pain EXAM: LEFT TIBIA AND FIBULA - 2 VIEW COMPARISON:  None. FINDINGS: On the lateral view, there  appears to be a nondisplaced fracture of the posterior proximal tibia which appears acute. Correlate with pain in this area. No joint effusion identified. No other fracture in the tibia or fibula. Negative ankle. IMPRESSION: Probable acute fracture posterior proximal tibia involving the knee joint. Correlate with physical exam. Electronically Signed   By: Franchot Gallo M.D.   On: 02/24/2020 16:13     ASSESSMENT and PLAN  1. Pain in left leg 2. Pain and swelling of left knee xrays shows probable acute fracture which is difficult to correlate clinical or by history. Advised ortho urgent care for further eval and treatment. Copy of xrays was given on cd.  - DG Knee 1-2 Views Left  No follow-ups on file.    Rutherford Guys, MD Primary Care at [facility] [place], [place] [address] Ph.  [phone] Fax [phone]

## 2020-03-01 DIAGNOSIS — M1712 Unilateral primary osteoarthritis, left knee: Secondary | ICD-10-CM | POA: Diagnosis not present

## 2020-03-08 ENCOUNTER — Telehealth: Payer: Self-pay | Admitting: Family Medicine

## 2020-03-08 NOTE — Telephone Encounter (Signed)
Pt is calling because she is not satisfied with Referral  she went to today .Does not want to be seen at [facility] / would like to be referred backj to office that you referred her to in June to Dr.Whitfeild

## 2020-03-22 ENCOUNTER — Ambulatory Visit: Payer: Medicare HMO | Admitting: Registered Nurse

## 2020-03-22 ENCOUNTER — Other Ambulatory Visit: Payer: Self-pay | Admitting: *Deleted

## 2020-03-22 ENCOUNTER — Telehealth: Payer: Self-pay | Admitting: *Deleted

## 2020-03-22 ENCOUNTER — Telehealth: Payer: Self-pay | Admitting: Family Medicine

## 2020-03-22 VITALS — BP 140/84 | Ht 60.0 in | Wt 146.0 lb

## 2020-03-22 DIAGNOSIS — Z0000 Encounter for general adult medical examination without abnormal findings: Secondary | ICD-10-CM

## 2020-03-22 MED ORDER — LISINOPRIL 40 MG PO TABS
40.0000 mg | ORAL_TABLET | Freq: Every day | ORAL | 0 refills | Status: DC
Start: 2020-03-22 — End: 2020-06-21

## 2020-03-22 MED ORDER — HYDROCHLOROTHIAZIDE 25 MG PO TABS
25.0000 mg | ORAL_TABLET | Freq: Every day | ORAL | 0 refills | Status: DC
Start: 2020-03-22 — End: 2020-06-21

## 2020-03-22 NOTE — Telephone Encounter (Signed)
Patient advised all medication are being sent.   She had a refill for the Tramadol at the pharmacy .

## 2020-03-22 NOTE — Telephone Encounter (Signed)
03/22/2020 - PATIENT IS REQUESTING DR. Benay Spice GIVE HER 1 REFILL ON HER TRAMADOL FOR HER (L) KNEE PAIN. SHE STATES SHE HAS 2 PILLS LEFT. HER TRANSFER OF CARE IS SCHEDULED WITH DR. Kittie Plater ON Wednesday 06/21/20 AT 3:20 pm. SHE WOULD LIKE TO BE CALLED WHEN  IT HAS BEEN DONE. BEST PHONE [phone] (CELL) [place]

## 2020-03-22 NOTE — Progress Notes (Signed)
Presents today for [employer] Visit   Date of last exam: 02-24-2020  Interpreter used for this visit? No  I connected with  Gina Ball on 03/22/20 by a telephone  and verified that I am speaking with the correct person using two identifiers.   I discussed the limitations of evaluation and management by telemedicine. The patient expressed understanding and agreed to proceed.  Patient location: home  Provider location: in office  I provided  20 minutes of non face - to - face time during this encounter.   Patient Care Team: Rutherford Guys, MD as PCP - General (Family Medicine)   Other items to address today:   Discussed Eye/Dental Discussed Immunizations Patient will schedule TOC/medcheck [place]  Other Screening: Last screening for diabetes: 11-13-2017 Last lipid screening: 11-13-2017  ADVANCE DIRECTIVES: Discussed: yes On File: no Materials Provided: no  Immunization status:  Immunization History  Administered Date(s) Administered  . Influenza, Seasonal, Injecte, Preservative Fre 04/03/2015  . Influenza,inj,quad, With Preservative 03/05/2012  . Influenza-Unspecified 05/08/2005, 05/08/2006, 06/13/2008, 04/24/2009, 04/13/2010  . PFIZER SARS-COV-2 Vaccination 09/09/2019, 10/10/2019  . Pneumococcal Conjugate-13 04/03/2015  . Pneumococcal Polysaccharide-23 04/13/2010  . Tdap 04/18/2010     Health Maintenance Due  Topic Date Due  . COLONOSCOPY  Never done     Functional Status Survey: Is the patient deaf or have difficulty hearing?: No Does the patient have difficulty seeing, even when wearing glasses/contacts?: No Does the patient have difficulty concentrating, remembering, or making decisions?: No Does the patient have difficulty walking or climbing stairs?: No Does the patient have difficulty dressing or bathing?: No Does the patient have difficulty doing errands alone such as visiting a doctor's office or shopping?: No   6CIT  Screen 03/22/2020 03/22/2019  What Year? 0 points 0 points  What month? 0 points 0 points  What time? 0 points 0 points  Count back from 20 0 points 0 points  Months in reverse 0 points 0 points  Repeat phrase 0 points 0 points  Total Score 0 0        Clinical Support from 03/22/2020 in [place] at [place]  AUDIT-C Score 1       Home Environment:  Lives on top stairs 17 stairs lives above daughter No scattered rugs No grab bars Adequate lighting/ no clutter No trouble climbing stairs    Patient Active Problem List   Diagnosis Date Noted  . Cancer ([place]) 09/15/2018  . Hyperlipidemia 12/02/2017  . Primary osteoarthritis of right knee 12/02/2017  . Essential hypertension, benign 11/13/2017  . Chronic pain of right knee 11/13/2017  . CAD (coronary artery disease) 10/07/2016  . Glaucoma 11/13/2015  . Osteopenia 09/05/2015  . CKD (chronic kidney disease) stage 3, GFR 30-59 ml/min 12/13/2014  . History of noncompliance with medical treatment, presenting hazards to health 09/15/2014  . Pain medication agreement 12/15/2013  . Allergic rhinitis 11/22/2011  . Hypertension goal BP (blood pressure) < 140/80 11/22/2011  . Hx of colonic polyp 11/22/2011  . Palpitation 11/22/2011     Past Medical History:  Diagnosis Date  . Cancer ([place])    HAD BREST CANCER 1977  . Chronic pain of right knee   . H/O myocardial infarction, greater than 8 weeks 1986   reports normal NM Scan  . HTN (hypertension)   . Hyperlipidemia      Past Surgical History:  Procedure Laterality Date  . BREAST SURGERY     BILATERAL     Family History  Problem Relation Age of Onset  . Healthy Daughter   . Lymphoma Son      Social History   Socioeconomic History  . Marital status: Widowed    Spouse name: Not on file  . Number of children: Not on file  . Years of education: Not on file  . Highest education level: Not on file  Occupational History  . Not on file  Tobacco Use  . Smoking  status: Never Smoker  . Smokeless tobacco: Never Used  Vaping Use  . Vaping Use: Never used  Substance and Sexual Activity  . Alcohol use: Yes    Comment: OCCASIONALLY  . Drug use: Not Currently  . Sexual activity: Not Currently  Other Topics Concern  . Not on file  Social History Narrative  . Not on file   Social Determinants of Health   Financial Resource Strain:   . Difficulty of Paying Living Expenses: Not on file  Food Insecurity:   . Worried About Charity fundraiser in the Last Year: Not on file  . Ran Out of Food in the Last Year: Not on file  Transportation Needs:   . Lack of Transportation (Medical): Not on file  . Lack of Transportation (Non-Medical): Not on file  Physical Activity:   . Days of Exercise per Week: Not on file  . Minutes of Exercise per Session: Not on file  Stress:   . Feeling of Stress : Not on file  Social Connections:   . Frequency of Communication with Friends and Family: Not on file  . Frequency of Social Gatherings with Friends and Family: Not on file  . Attends Religious Services: Not on file  . Active Member of Clubs or Organizations: Not on file  . Attends Archivist Meetings: Not on file  . Marital Status: Not on file  Intimate Partner Violence:   . Fear of Current or Ex-Partner: Not on file  . Emotionally Abused: Not on file  . Physically Abused: Not on file  . Sexually Abused: Not on file     No Known Allergies   Prior to Admission medications   Medication Sig Start Date End Date Taking? Authorizing Provider  aspirin EC 81 MG tablet Take 81 mg by mouth daily.   Yes [provider]  hydrochlorothiazide (HYDRODIURIL) 25 MG tablet Take 1 tablet (25 mg total) by mouth daily. 02/07/20  Yes Rutherford Guys, MD  lisinopril (ZESTRIL) 40 MG tablet Take 1 tablet (40 mg total) by mouth daily. 02/07/20  Yes Rutherford Guys, MD  traMADol (ULTRAM) 50 MG tablet Take 1 tablet (50 mg total) by mouth every 6 (six) hours as  needed. 02/08/20  Yes Rutherford Guys, MD     Depression screen [facility] 2/9 03/22/2020 03/22/2019 02/25/2019 09/22/2018 12/02/2017  Decreased Interest 0 0 0 0 0  Down, Depressed, Hopeless 0 0 0 0 0  PHQ - 2 Score 0 0 0 0 0     Fall Risk  03/22/2020 03/22/2019 02/25/2019 09/22/2018 12/02/2017  Falls in the past year? 1 0 0 0 No  Number falls in past yr: 0 0 0 0 -  Comment icey steps missed last step/ - - - -  Injury with Fall? 0 0 0 0 -  Follow up Falls evaluation completed;Education provided Falls evaluation completed;Education provided;Falls prevention discussed - Falls evaluation completed -      PHYSICAL EXAM: BP 140/84 Comment: not in clinic/ taken from previous  Ht 5' (1.524 m)  Wt 146 lb (66.2 kg)   BMI 28.51 kg/m    Wt Readings from Last 3 Encounters:  03/22/20 146 lb (66.2 kg)  02/24/20 146 lb 3.2 oz (66.3 kg)  03/22/19 133 lb (60.3 kg)       Education/Counseling provided regarding diet and exercise, prevention of chronic diseases, smoking/tobacco cessation, if applicable, and reviewed "Covered Medicare Preventive Services."

## 2020-03-22 NOTE — Addendum Note (Signed)
Addended by: Kittie Plater, Lowana Hable HUA on: 03/22/2020 11:37 AM   Modules accepted: Orders

## 2020-03-22 NOTE — Telephone Encounter (Signed)
Called pt and she stated she was told her medication was taken care of so she doesn't need an appt

## 2020-03-22 NOTE — Telephone Encounter (Signed)
Put in labs for patient physical on 12-29.  Patient will come in on 12-22 to have these done

## 2020-03-22 NOTE — Patient Instructions (Signed)
Thank you for taking time to come for your Medicare Wellness Visit. I appreciate your ongoing commitment to your health goals. Please review the following plan we discussed and let me know if I can assist you in the future.  Raeleen Winstanley LPN  Preventive Care 75 Years and Older, Female Preventive care refers to lifestyle choices and visits with your health care provider that can promote health and wellness. This includes:  A yearly physical exam. This is also called an annual well check.  Regular dental and eye exams.  Immunizations.  Screening for certain conditions.  Healthy lifestyle choices, such as diet and exercise. What can I expect for my preventive care visit? Physical exam Your health care provider will check:  Height and weight. These may be used to calculate body mass index (BMI), which is a measurement that tells if you are at a healthy weight.  Heart rate and blood pressure.  Your skin for abnormal spots. Counseling Your health care provider may ask you questions about:  Alcohol, tobacco, and drug use.  Emotional well-being.  Home and relationship well-being.  Sexual activity.  Eating habits.  History of falls.  Memory and ability to understand (cognition).  Work and work environment.  Pregnancy and menstrual history. What immunizations do I need?  Influenza (flu) vaccine  This is recommended every year. Tetanus, diphtheria, and pertussis (Tdap) vaccine  You may need a Td booster every 10 years. Varicella (chickenpox) vaccine  You may need this vaccine if you have not already been vaccinated. Zoster (shingles) vaccine  You may need this after age 60. Pneumococcal conjugate (PCV13) vaccine  One dose is recommended after age 75. Pneumococcal polysaccharide (PPSV23) vaccine  One dose is recommended after age 75. Measles, mumps, and rubella (MMR) vaccine  You may need at least one dose of MMR if you were born in 1957 or later. You may also  need a second dose. Meningococcal conjugate (MenACWY) vaccine  You may need this if you have certain conditions. Hepatitis A vaccine  You may need this if you have certain conditions or if you travel or work in places where you may be exposed to hepatitis A. Hepatitis B vaccine  You may need this if you have certain conditions or if you travel or work in places where you may be exposed to hepatitis B. Haemophilus influenzae type b (Hib) vaccine  You may need this if you have certain conditions. You may receive vaccines as individual doses or as more than one vaccine together in one shot (combination vaccines). Talk with your health care provider about the risks and benefits of combination vaccines. What tests do I need? Blood tests  Lipid and cholesterol levels. These may be checked every 5 years, or more frequently depending on your overall health.  Hepatitis C test.  Hepatitis B test. Screening  Lung cancer screening. You may have this screening every year starting at age 55 if you have a 30-pack-year history of smoking and currently smoke or have quit within the past 15 years.  Colorectal cancer screening. All adults should have this screening starting at age 75 and continuing until age 75. Your health care provider may recommend screening at age 45 if you are at increased risk. You will have tests every 1-10 years, depending on your results and the type of screening test.  Diabetes screening. This is done by checking your blood sugar (glucose) after you have not eaten for a while (fasting). You may have this done every 1-3   years.  Mammogram. This may be done every 1-2 years. Talk with your health care provider about how often you should have regular mammograms.  BRCA-related cancer screening. This may be done if you have a family history of breast, ovarian, tubal, or peritoneal cancers. Other tests  Sexually transmitted disease (STD) testing.  Bone density scan. This is done  to screen for osteoporosis. You may have this done starting at age 75. Follow these instructions at home: Eating and drinking  Eat a diet that includes fresh fruits and vegetables, whole grains, lean protein, and low-fat dairy products. Limit your intake of foods with high amounts of sugar, saturated fats, and salt.  Take vitamin and mineral supplements as recommended by your health care provider.  Do not drink alcohol if your health care provider tells you not to drink.  If you drink alcohol: ? Limit how much you have to 0-1 drink a day. ? Be aware of how much alcohol is in your drink. In the [place] one drink equals one 12 oz bottle of beer (355 mL), one 5 oz glass of wine (148 mL), or one 1 oz glass of hard liquor (44 mL). Lifestyle  Take daily care of your teeth and gums.  Stay active. Exercise for at least 30 minutes on 5 or more days each week.  Do not use any products that contain nicotine or tobacco, such as cigarettes, e-cigarettes, and chewing tobacco. If you need help quitting, ask your health care provider.  If you are sexually active, practice safe sex. Use a condom or other form of protection in order to prevent STIs (sexually transmitted infections).  Talk with your health care provider about taking a low-dose aspirin or statin. What's next?  Go to your health care provider once a year for a well check visit.  Ask your health care provider how often you should have your eyes and teeth checked.  Stay up to date on all vaccines. This information is not intended to replace advice given to you by your health care provider. Make sure you discuss any questions you have with your health care provider. Document Revised: 06/04/2018 Document Reviewed: 06/04/2018 Elsevier Patient Education  2020 [employer].

## 2020-03-22 NOTE — Telephone Encounter (Signed)
Can please call Ms. Audi she would like to see Mitchel Honour now that [place] is leaving.  She is due for a med check for hypertension Thanks

## 2020-03-22 NOTE — Addendum Note (Signed)
Addended by: Meredeth Ide on: 03/22/2020 02:58 PM   Modules accepted: Orders

## 2020-04-03 NOTE — Telephone Encounter (Signed)
Hi Gina Ball, future labs were put in by Dr Pamella Pert on 03/22/2020. See in patient's chart. Thanks

## 2020-04-06 ENCOUNTER — Telehealth: Payer: Self-pay

## 2020-04-06 ENCOUNTER — Telehealth: Payer: Self-pay | Admitting: Registered Nurse

## 2020-04-06 DIAGNOSIS — Z1211 Encounter for screening for malignant neoplasm of colon: Secondary | ICD-10-CM

## 2020-04-06 NOTE — Telephone Encounter (Signed)
Spoke with [facility] w/ cologuard and gave additional dx code screening for colon cancer. Z12.11

## 2020-04-06 NOTE — Telephone Encounter (Signed)
[id number]  A cologuard test  has not been shipped because of the ICD 10 code that was placed. Please advise Chief Strategy Officer at [phone].

## 2020-04-28 ENCOUNTER — Ambulatory Visit: Payer: Medicare HMO

## 2020-05-05 ENCOUNTER — Ambulatory Visit: Payer: Medicare HMO | Attending: Internal Medicine

## 2020-05-05 ENCOUNTER — Other Ambulatory Visit (HOSPITAL_BASED_OUTPATIENT_CLINIC_OR_DEPARTMENT_OTHER): Payer: Self-pay | Admitting: Internal Medicine

## 2020-05-05 DIAGNOSIS — Z23 Encounter for immunization: Secondary | ICD-10-CM

## 2020-05-05 NOTE — Progress Notes (Signed)
° °  Covid-19 Vaccination Clinic  Name:  Gina Ball    MRN: [id number] DOB: Feb 06, 1946  05/05/2020  [name] was observed post Covid-19 immunization for 15 minutes without incident. She was provided with Vaccine Information Sheet and instruction to access the V-Safe system.   [name] was instructed to call 911 with any severe reactions post vaccine:  Difficulty breathing   Swelling of face and throat   A fast heartbeat   A bad rash all over body   Dizziness and weakness

## 2020-05-08 MED FILL — PFIZER-BIONTECH COVID-19 VA: 30 | 1 days supply | Qty: 0 | Fill #0

## 2020-06-21 ENCOUNTER — Ambulatory Visit (INDEPENDENT_AMBULATORY_CARE_PROVIDER_SITE_OTHER): Payer: Medicare HMO | Admitting: Emergency Medicine

## 2020-06-21 ENCOUNTER — Encounter: Payer: Self-pay | Admitting: Emergency Medicine

## 2020-06-21 ENCOUNTER — Other Ambulatory Visit: Payer: Self-pay

## 2020-06-21 VITALS — BP 122/80 | HR 95 | Temp 98.1°F | Ht 60.0 in | Wt 145.0 lb

## 2020-06-21 DIAGNOSIS — M25561 Pain in right knee: Secondary | ICD-10-CM | POA: Diagnosis not present

## 2020-06-21 DIAGNOSIS — E785 Hyperlipidemia, unspecified: Secondary | ICD-10-CM

## 2020-06-21 DIAGNOSIS — Z7689 Persons encountering health services in other specified circumstances: Secondary | ICD-10-CM | POA: Diagnosis not present

## 2020-06-21 DIAGNOSIS — G8929 Other chronic pain: Secondary | ICD-10-CM

## 2020-06-21 DIAGNOSIS — I10 Essential (primary) hypertension: Secondary | ICD-10-CM | POA: Diagnosis not present

## 2020-06-21 MED ORDER — TRAMADOL HCL 50 MG PO TABS: 50.0000 mg | ORAL_TABLET | Freq: Four times a day (QID) | ORAL | 2 refills | Status: DC | PRN

## 2020-06-21 MED ORDER — LISINOPRIL 40 MG PO TABS: 40.0000 mg | ORAL_TABLET | Freq: Every day | ORAL | 3 refills | Status: DC

## 2020-06-21 MED ORDER — HYDROCHLOROTHIAZIDE 25 MG PO TABS: 25.0000 mg | ORAL_TABLET | Freq: Every day | ORAL | 3 refills | Status: DC

## 2020-06-21 NOTE — Progress Notes (Signed)
Gina Ball 74 y.o.   Chief Complaint  Patient presents with  . Transitions Of Care    Pt reports she feels well with no complaints other than medications refilled. Pt states she has taken her BP medication today    HISTORY OF PRESENT ILLNESS: This is a 74 y.o. female with history of hypertension here for follow-up and to establish care with me.  Used to see Dr. Leretha PolSantiago. Has no complaints or medical concerns today. Takes hydrochlorothiazide 25 mg and lisinopril 40 mg daily. Also has chronic pain to her left knee, taking tramadol almost on a daily basis.  HPI   Prior to Admission medications   Medication Sig Start Date End Date Taking? Authorizing Provider  aspirin EC 81 MG tablet Take 81 mg by mouth daily.   Yes [provider]  hydrochlorothiazide (HYDRODIURIL) 25 MG tablet Take 1 tablet (25 mg total) by mouth daily. 06/21/20   Georgina QuintSagardia, Synethia Endicott Jose, MD  lisinopril (ZESTRIL) 40 MG tablet Take 1 tablet (40 mg total) by mouth daily. 06/21/20   Georgina QuintSagardia, Shalicia Craghead Jose, MD  traMADol (ULTRAM) 50 MG tablet Take 1 tablet (50 mg total) by mouth every 6 (six) hours as needed. 06/21/20   Georgina QuintSagardia, Vern Guerette Jose, MD    No Known Allergies  Patient Active Problem List   Diagnosis Date Noted  . Cancer (HCC) 09/15/2018  . Hyperlipidemia 12/02/2017  . Primary osteoarthritis of right knee 12/02/2017  . Essential hypertension, benign 11/13/2017  . Chronic pain of right knee 11/13/2017  . CAD (coronary artery disease) 10/07/2016  . Glaucoma 11/13/2015  . Osteopenia 09/05/2015  . CKD (chronic kidney disease) stage 3, GFR 30-59 ml/min (HCC) 12/13/2014  . History of noncompliance with medical treatment, presenting hazards to health 09/15/2014  . Pain medication agreement 12/15/2013  . Allergic rhinitis 11/22/2011  . Hypertension goal BP (blood pressure) < 140/80 11/22/2011  . Hx of colonic polyp 11/22/2011  . Palpitation 11/22/2011    Past Medical History:  Diagnosis Date  .  Cancer (HCC)    HAD BREST CANCER 1977  . Chronic pain of right knee   . H/O myocardial infarction, greater than 8 weeks 1986   reports normal NM Scan  . HTN (hypertension)   . Hyperlipidemia     Past Surgical History:  Procedure Laterality Date  . BREAST SURGERY     BILATERAL    Social History   Socioeconomic History  . Marital status: Widowed    Spouse name: Not on file  . Number of children: Not on file  . Years of education: Not on file  . Highest education level: Not on file  Occupational History  . Not on file  Tobacco Use  . Smoking status: Never Smoker  . Smokeless tobacco: Never Used  Vaping Use  . Vaping Use: Never used  Substance and Sexual Activity  . Alcohol use: Yes    Comment: OCCASIONALLY  . Drug use: Not Currently  . Sexual activity: Not Currently  Other Topics Concern  . Not on file  Social History Narrative  . Not on file   Social Determinants of Health   Financial Resource Strain: Not on file  Food Insecurity: Not on file  Transportation Needs: Not on file  Physical Activity: Not on file  Stress: Not on file  Social Connections: Not on file  Intimate Partner Violence: Not on file    Family History  Problem Relation Age of Onset  . Healthy Daughter   . Lymphoma Son  Review of Systems  Constitutional: Negative.  Negative for chills and fever.  HENT: Negative.  Negative for congestion and sore throat.   Respiratory: Negative.  Negative for cough and shortness of breath.   Cardiovascular: Negative.  Negative for chest pain and palpitations.  Gastrointestinal: Negative.  Negative for abdominal pain, diarrhea, nausea and vomiting.  Genitourinary: Negative.  Negative for dysuria and hematuria.  Musculoskeletal: Positive for joint pain.  Skin: Negative.  Negative for rash.  Neurological: Negative.  Negative for dizziness and headaches.  All other systems reviewed and are negative.    Today's Vitals   06/21/20 1511 06/21/20 1520   BP: (!) 167/84 (!) 142/82  Pulse: 95   Temp: 98.1 F (36.7 C)   TempSrc: Temporal   SpO2: 96%   Weight: 145 lb (65.8 kg)   Height: 5' (1.524 m)    Body mass index is 28.32 kg/m.   Physical Exam Vitals reviewed.  Constitutional:      Appearance: Normal appearance.  HENT:     Head: Normocephalic.  Eyes:     Extraocular Movements: Extraocular movements intact.     Pupils: Pupils are equal, round, and reactive to light.  Cardiovascular:     Rate and Rhythm: Normal rate and regular rhythm.     Pulses: Normal pulses.     Heart sounds: Normal heart sounds.  Pulmonary:     Effort: Pulmonary effort is normal.     Breath sounds: Normal breath sounds.  Musculoskeletal:        General: Normal range of motion.     Cervical back: Normal range of motion and neck supple.  Skin:    General: Skin is warm and dry.     Capillary Refill: Capillary refill takes less than 2 seconds.  Neurological:     General: No focal deficit present.     Mental Status: She is alert and oriented to person, place, and time.  Psychiatric:        Mood and Affect: Mood normal.        Behavior: Behavior normal.      ASSESSMENT & PLAN: Gina Ball was seen today for transitions of care.  Diagnoses and all orders for this visit:  Essential hypertension, benign -     hydrochlorothiazide (HYDRODIURIL) 25 MG tablet; Take 1 tablet (25 mg total) by mouth daily.  Hyperlipidemia, unspecified hyperlipidemia type -     lisinopril (ZESTRIL) 40 MG tablet; Take 1 tablet (40 mg total) by mouth daily.  Chronic pain of right knee -     traMADol (ULTRAM) 50 MG tablet; Take 1 tablet (50 mg total) by mouth every 6 (six) hours as needed.  Encounter to establish care    Patient Instructions       If you have lab work done today you will be contacted with your lab results within the next 2 weeks.  If you have not heard from [place] then please contact us. The fastest way to get your results is to register for My  Chart.   IF you received an x-ray today, you will receive an invoice from [facility]. Please contact [facility] at [phone] with questions or concerns regarding your invoice.   IF you received labwork today, you will receive an invoice from [place]. Please contact [facility] at [phone] with questions or concerns regarding your invoice.   Our billing staff will not be able to assist you with questions regarding bills from these companies.  You will be contacted with the lab results  as soon as they are available. The fastest way to get your results is to activate your My Chart account. Instructions are located on the last page of this paperwork. If you have not heard from [place] regarding the results in 2 weeks, please contact this office.     Health Maintenance After Age 32 After age 47, you are at a higher risk for certain long-term diseases and infections as well as injuries from falls. Falls are a major cause of broken bones and head injuries in people who are older than age 62. Getting regular preventive care can help to keep you healthy and well. Preventive care includes getting regular testing and making lifestyle changes as recommended by your health care provider. Talk with your health care provider about:  Which screenings and tests you should have. A screening is a test that checks for a disease when you have no symptoms.  A diet and exercise plan that is right for you. What should I know about screenings and tests to prevent falls? Screening and testing are the best ways to find a health problem early. Early diagnosis and treatment give you the best chance of managing medical conditions that are common after age 70. Certain conditions and lifestyle choices may make you more likely to have a fall. Your health care provider may recommend:  Regular vision checks. Poor vision and conditions such as cataracts can make you more likely to have a fall. If you wear  glasses, make sure to get your prescription updated if your vision changes.  Medicine review. Work with your health care provider to regularly review all of the medicines you are taking, including over-the-counter medicines. Ask your health care provider about any side effects that may make you more likely to have a fall. Tell your health care provider if any medicines that you take make you feel dizzy or sleepy.  Osteoporosis screening. Osteoporosis is a condition that causes the bones to get weaker. This can make the bones weak and cause them to break more easily.  Blood pressure screening. Blood pressure changes and medicines to control blood pressure can make you feel dizzy.  Strength and balance checks. Your health care provider may recommend certain tests to check your strength and balance while standing, walking, or changing positions.  Foot health exam. Foot pain and numbness, as well as not wearing proper footwear, can make you more likely to have a fall.  Depression screening. You may be more likely to have a fall if you have a fear of falling, feel emotionally low, or feel unable to do activities that you used to do.  Alcohol use screening. Using too much alcohol can affect your balance and may make you more likely to have a fall. What actions can I take to lower my risk of falls? General instructions  Talk with your health care provider about your risks for falling. Tell your health care provider if: ? You fall. Be sure to tell your health care provider about all falls, even ones that seem minor. ? You feel dizzy, sleepy, or off-balance.  Take over-the-counter and prescription medicines only as told by your health care provider. These include any supplements.  Eat a healthy diet and maintain a healthy weight. A healthy diet includes low-fat dairy products, low-fat (lean) meats, and fiber from whole grains, beans, and lots of fruits and vegetables. Home safety  Remove any  tripping hazards, such as rugs, cords, and clutter.  Install safety equipment such as grab  bars in bathrooms and safety rails on stairs.  Keep rooms and walkways well-lit. Activity   Follow a regular exercise program to stay fit. This will help you maintain your balance. Ask your health care provider what types of exercise are appropriate for you.  If you need a cane or walker, use it as recommended by your health care provider.  Wear supportive shoes that have nonskid soles. Lifestyle  Do not drink alcohol if your health care provider tells you not to drink.  If you drink alcohol, limit how much you have: ? 0-1 drink a day for women. ? 0-2 drinks a day for men.  Be aware of how much alcohol is in your drink. In the [place] one drink equals one typical bottle of beer (12 oz), one-half glass of wine (5 oz), or one shot of hard liquor (1 oz).  Do not use any products that contain nicotine or tobacco, such as cigarettes and e-cigarettes. If you need help quitting, ask your health care provider. Summary  Having a healthy lifestyle and getting preventive care can help to protect your health and wellness after age 55.  Screening and testing are the best way to find a health problem early and help you avoid having a fall. Early diagnosis and treatment give you the best chance for managing medical conditions that are more common for people who are older than age 70.  Falls are a major cause of broken bones and head injuries in people who are older than age 52. Take precautions to prevent a fall at home.  Work with your health care provider to learn what changes you can make to improve your health and wellness and to prevent falls. This information is not intended to replace advice given to you by your health care provider. Make sure you discuss any questions you have with your health care provider. Document Revised: 10/01/2018 Document Reviewed: 04/23/2017 Elsevier Patient Education  2020  [employer]      Edwina Barth, MD [facility]

## 2020-06-21 NOTE — Patient Instructions (Addendum)
   If you have lab work done today you will be contacted with your lab results within the next 2 weeks.  If you have not heard from us then please contact us. The fastest way to get your results is to register for My Chart.   IF you received an x-ray today, you will receive an invoice from [facility]. Please contact [facility] at [phone] with questions or concerns regarding your invoice.   IF you received labwork today, you will receive an invoice from [facility]. Please contact [facility] at [phone] with questions or concerns regarding your invoice.   Our billing staff will not be able to assist you with questions regarding bills from these companies.  You will be contacted with the lab results as soon as they are available. The fastest way to get your results is to activate your My Chart account. Instructions are located on the last page of this paperwork. If you have not heard from us regarding the results in 2 weeks, please contact this office.     Health Maintenance After Age 65 After age 65, you are at a higher risk for certain long-term diseases and infections as well as injuries from falls. Falls are a major cause of broken bones and head injuries in people who are older than age 65. Getting regular preventive care can help to keep you healthy and well. Preventive care includes getting regular testing and making lifestyle changes as recommended by your health care provider. Talk with your health care provider about:  Which screenings and tests you should have. A screening is a test that checks for a disease when you have no symptoms.  A diet and exercise plan that is right for you. What should I know about screenings and tests to prevent falls? Screening and testing are the best ways to find a health problem early. Early diagnosis and treatment give you the best chance of managing medical conditions that are common after age 65. Certain conditions and  lifestyle choices may make you more likely to have a fall. Your health care provider may recommend:  Regular vision checks. Poor vision and conditions such as cataracts can make you more likely to have a fall. If you wear glasses, make sure to get your prescription updated if your vision changes.  Medicine review. Work with your health care provider to regularly review all of the medicines you are taking, including over-the-counter medicines. Ask your health care provider about any side effects that may make you more likely to have a fall. Tell your health care provider if any medicines that you take make you feel dizzy or sleepy.  Osteoporosis screening. Osteoporosis is a condition that causes the bones to get weaker. This can make the bones weak and cause them to break more easily.  Blood pressure screening. Blood pressure changes and medicines to control blood pressure can make you feel dizzy.  Strength and balance checks. Your health care provider may recommend certain tests to check your strength and balance while standing, walking, or changing positions.  Foot health exam. Foot pain and numbness, as well as not wearing proper footwear, can make you more likely to have a fall.  Depression screening. You may be more likely to have a fall if you have a fear of falling, feel emotionally low, or feel unable to do activities that you used to do.  Alcohol use screening. Using too much alcohol can affect your balance and may make you more likely to   have a fall. What actions can I take to lower my risk of falls? General instructions  Talk with your health care provider about your risks for falling. Tell your health care provider if: ? You fall. Be sure to tell your health care provider about all falls, even ones that seem minor. ? You feel dizzy, sleepy, or off-balance.  Take over-the-counter and prescription medicines only as told by your health care provider. These include any  supplements.  Eat a healthy diet and maintain a healthy weight. A healthy diet includes low-fat dairy products, low-fat (lean) meats, and fiber from whole grains, beans, and lots of fruits and vegetables. Home safety  Remove any tripping hazards, such as rugs, cords, and clutter.  Install safety equipment such as grab bars in bathrooms and safety rails on stairs.  Keep rooms and walkways well-lit. Activity   Follow a regular exercise program to stay fit. This will help you maintain your balance. Ask your health care provider what types of exercise are appropriate for you.  If you need a cane or walker, use it as recommended by your health care provider.  Wear supportive shoes that have nonskid soles. Lifestyle  Do not drink alcohol if your health care provider tells you not to drink.  If you drink alcohol, limit how much you have: ? 0-1 drink a day for women. ? 0-2 drinks a day for men.  Be aware of how much alcohol is in your drink. In the [place] one drink equals one typical bottle of beer (12 oz), one-half glass of wine (5 oz), or one shot of hard liquor (1 oz).  Do not use any products that contain nicotine or tobacco, such as cigarettes and e-cigarettes. If you need help quitting, ask your health care provider. Summary  Having a healthy lifestyle and getting preventive care can help to protect your health and wellness after age 65.  Screening and testing are the best way to find a health problem early and help you avoid having a fall. Early diagnosis and treatment give you the best chance for managing medical conditions that are more common for people who are older than age 65.  Falls are a major cause of broken bones and head injuries in people who are older than age 65. Take precautions to prevent a fall at home.  Work with your health care provider to learn what changes you can make to improve your health and wellness and to prevent falls. This information is not intended  to replace advice given to you by your health care provider. Make sure you discuss any questions you have with your health care provider. Document Revised: 10/01/2018 Document Reviewed: 04/23/2017 Elsevier Patient Education  2020 Elsevier Inc.  

## 2020-08-21 ENCOUNTER — Ambulatory Visit: Payer: Medicare HMO | Admitting: Emergency Medicine

## 2020-09-25 ENCOUNTER — Other Ambulatory Visit: Payer: Self-pay

## 2020-09-26 ENCOUNTER — Other Ambulatory Visit: Payer: Self-pay | Admitting: Emergency Medicine

## 2020-09-26 ENCOUNTER — Encounter: Payer: Self-pay | Admitting: Emergency Medicine

## 2020-09-26 ENCOUNTER — Ambulatory Visit (INDEPENDENT_AMBULATORY_CARE_PROVIDER_SITE_OTHER): Payer: Medicare HMO | Admitting: Emergency Medicine

## 2020-09-26 VITALS — BP 138/78 | HR 74 | Temp 98.2°F | Ht 60.0 in | Wt 143.2 lb

## 2020-09-26 DIAGNOSIS — J302 Other seasonal allergic rhinitis: Secondary | ICD-10-CM | POA: Diagnosis not present

## 2020-09-26 DIAGNOSIS — M25561 Pain in right knee: Secondary | ICD-10-CM | POA: Diagnosis not present

## 2020-09-26 DIAGNOSIS — I10 Essential (primary) hypertension: Secondary | ICD-10-CM

## 2020-09-26 DIAGNOSIS — E785 Hyperlipidemia, unspecified: Secondary | ICD-10-CM

## 2020-09-26 DIAGNOSIS — G8929 Other chronic pain: Secondary | ICD-10-CM

## 2020-09-26 LAB — LIPID PANEL
Cholesterol: 236 mg/dL — ABNORMAL HIGH (ref 0–200)
HDL: 73.4 mg/dL (ref >39.00)
LDL Cholesterol: 138 mg/dL — ABNORMAL HIGH (ref 0–99)
NonHDL: 162.49
Total CHOL/HDL Ratio: 3
Triglycerides: 124 mg/dL (ref 0.0–149.0)
VLDL: 24.8 mg/dL (ref 0.0–40.0)

## 2020-09-26 LAB — COMPREHENSIVE METABOLIC PANEL
ALT: 18 U/L (ref 0–35)
AST: 18 U/L (ref 0–37)
Albumin: 4.2 g/dL (ref 3.5–5.2)
Alkaline Phosphatase: 37 U/L — ABNORMAL LOW (ref 39–117)
BUN: 33 mg/dL — ABNORMAL HIGH (ref 6–23)
CO2: 23 mEq/L (ref 19–32)
Calcium: 9.3 mg/dL (ref 8.4–10.5)
Chloride: 107 mEq/L (ref 96–112)
Creatinine, Ser: 1.29 mg/dL — ABNORMAL HIGH (ref 0.40–1.20)
GFR: 40.53 mL/min — ABNORMAL LOW (ref >60.00)
Glucose, Bld: 91 mg/dL (ref 70–99)
Potassium: 4.1 mEq/L (ref 3.5–5.1)
Sodium: 140 mEq/L (ref 135–145)
Total Bilirubin: 0.4 mg/dL (ref 0.2–1.2)
Total Protein: 6.8 g/dL (ref 6.0–8.3)

## 2020-09-26 MED ORDER — TRAMADOL HCL 50 MG PO TABS: 50.0000 mg | ORAL_TABLET | Freq: Two times a day (BID) | ORAL | 2 refills | Status: DC | PRN

## 2020-09-26 MED ORDER — ROSUVASTATIN CALCIUM 10 MG PO TABS: 10.0000 mg | ORAL_TABLET | Freq: Every day | ORAL | 3 refills | Status: DC

## 2020-09-26 MED ORDER — MONTELUKAST SODIUM 10 MG PO TABS: 10.0000 mg | ORAL_TABLET | Freq: Every day | ORAL | 3 refills | Status: DC

## 2020-09-26 NOTE — Patient Instructions (Signed)
Health Maintenance After Age 76 After age 76, you are at a higher risk for certain long-term diseases and infections as well as injuries from falls. Falls are a major cause of broken bones and head injuries in people who are older than age 76. Getting regular preventive care can help to keep you healthy and well. Preventive care includes getting regular testing and making lifestyle changes as recommended by your health care provider. Talk with your health care provider about:  Which screenings and tests you should have. A screening is a test that checks for a disease when you have no symptoms.  A diet and exercise plan that is right for you. What should I know about screenings and tests to prevent falls? Screening and testing are the best ways to find a health problem early. Early diagnosis and treatment give you the best chance of managing medical conditions that are common after age 76. Certain conditions and lifestyle choices may make you more likely to have a fall. Your health care provider may recommend:  Regular vision checks. Poor vision and conditions such as cataracts can make you more likely to have a fall. If you wear glasses, make sure to get your prescription updated if your vision changes.  Medicine review. Work with your health care provider to regularly review all of the medicines you are taking, including over-the-counter medicines. Ask your health care provider about any side effects that may make you more likely to have a fall. Tell your health care provider if any medicines that you take make you feel dizzy or sleepy.  Osteoporosis screening. Osteoporosis is a condition that causes the bones to get weaker. This can make the bones weak and cause them to break more easily.  Blood pressure screening. Blood pressure changes and medicines to control blood pressure can make you feel dizzy.  Strength and balance checks. Your health care provider may recommend certain tests to check your  strength and balance while standing, walking, or changing positions.  Foot health exam. Foot pain and numbness, as well as not wearing proper footwear, can make you more likely to have a fall.  Depression screening. You may be more likely to have a fall if you have a fear of falling, feel emotionally low, or feel unable to do activities that you used to do.  Alcohol use screening. Using too much alcohol can affect your balance and may make you more likely to have a fall. What actions can I take to lower my risk of falls? General instructions  Talk with your health care provider about your risks for falling. Tell your health care provider if: ? You fall. Be sure to tell your health care provider about all falls, even ones that seem minor. ? You feel dizzy, sleepy, or off-balance.  Take over-the-counter and prescription medicines only as told by your health care provider. These include any supplements.  Eat a healthy diet and maintain a healthy weight. A healthy diet includes low-fat dairy products, low-fat (lean) meats, and fiber from whole grains, beans, and lots of fruits and vegetables. Home safety  Remove any tripping hazards, such as rugs, cords, and clutter.  Install safety equipment such as grab bars in bathrooms and safety rails on stairs.  Keep rooms and walkways well-lit. Activity  Follow a regular exercise program to stay fit. This will help you maintain your balance. Ask your health care provider what types of exercise are appropriate for you.  If you need a cane or walker,   use it as recommended by your health care provider.  Wear supportive shoes that have nonskid soles.   Lifestyle  Do not drink alcohol if your health care provider tells you not to drink.  If you drink alcohol, limit how much you have: ? 0-1 drink a day for women. ? 0-2 drinks a day for men.  Be aware of how much alcohol is in your drink. In the [place] one drink equals one typical bottle of beer (12  oz), one-half glass of wine (5 oz), or one shot of hard liquor (1 oz).  Do not use any products that contain nicotine or tobacco, such as cigarettes and e-cigarettes. If you need help quitting, ask your health care provider. Summary  Having a healthy lifestyle and getting preventive care can help to protect your health and wellness after age 76.  Screening and testing are the best way to find a health problem early and help you avoid having a fall. Early diagnosis and treatment give you the best chance for managing medical conditions that are more common for people who are older than age 76.  Falls are a major cause of broken bones and head injuries in people who are older than age 76. Take precautions to prevent a fall at home.  Work with your health care provider to learn what changes you can make to improve your health and wellness and to prevent falls. This information is not intended to replace advice given to you by your health care provider. Make sure you discuss any questions you have with your health care provider. Document Revised: 10/01/2018 Document Reviewed: 04/23/2017 Elsevier Patient Education  2021 Elsevier Inc.  

## 2020-09-26 NOTE — Progress Notes (Signed)
Call patient please.  Blood work shows abnormal lipid profile with elevated cholesterol.  Recommend to start cholesterol medication, rosuvastatin 10 mg daily.  New prescription sent to pharmacy of record. Blood work also shows chronic kidney disease most likely related to longstanding hypertension.  Recommend to stay well-hydrated and avoid NSAIDs such as Motrin, ibuprofen, Aleve, Advil.  Thanks.

## 2020-09-26 NOTE — Progress Notes (Signed)
Gina Ball 75 y.o.   Chief Complaint  Patient presents with  . Hypertension    Follow up 2 months   . Medication Refill    Tramadol    HISTORY OF PRESENT ILLNESS: This is a 75 y.o. female with history of hypertension here for follow-up. Presently taking lisinopril 40 mg and hydrochlorothiazide 25 mg daily. Also has history of chronic left knee pain.  Takes tramadol as needed.  Requesting refill. Has history of chronic seasonal allergies. No other complaints or medical concerns today. The 10-year ASCVD risk score Mikey Bussing DC Jr., et al., 2013) is: 24.4%   Values used to calculate the score:     Age: 39 years     Sex: Female     Is Non-Hispanic African American: No     Diabetic: No     Tobacco smoker: No     Systolic Blood Pressure: 884 mmHg     Is BP treated: Yes     HDL Cholesterol: 70 mg/dL     Total Cholesterol: 229 mg/dL Patient has history of dyslipidemia but has never been on cholesterol medication even though it was recommended in the past. Patient somewhat reluctant to take cholesterol medication.  States diet works for her.  Fasting today.  We will do lipid profile today. Patient walks 1 mile every day without chest pain or significant dyspnea.  HPI   Prior to Admission medications   Medication Sig Start Date End Date Taking? Authorizing Provider  Ascorbic Acid (VITAMIN C) 1000 MG tablet Take 1,000 mg by mouth daily.   Yes [provider]  aspirin EC 81 MG tablet Take 81 mg by mouth daily.   Yes [provider]  Cyanocobalamin (VITAMIN B 12 PO) Take 100 mg by mouth daily.   Yes [provider]  hydrochlorothiazide (HYDRODIURIL) 25 MG tablet Take 1 tablet (25 mg total) by mouth daily. 06/21/20  Yes Kalea Perine, Ines Bloomer, MD  lisinopril (ZESTRIL) 40 MG tablet Take 1 tablet (40 mg total) by mouth daily. 06/21/20  Yes Shandrell Boda, Ines Bloomer, MD  Multiple Vitamin (MULTIVITAMIN) tablet Take 1 tablet by mouth daily.   Yes [provider]   traMADol (ULTRAM) 50 MG tablet Take 1 tablet (50 mg total) by mouth every 6 (six) hours as needed. 06/21/20  Yes Iliza Blankenbeckler, Ines Bloomer, MD  Vitamin D, Cholecalciferol, 25 MCG (1000 UT) TABS Take by mouth daily.   Yes [provider]  Zinc 50 MG CAPS Take by mouth daily.   Yes [provider]  COVID-19 mRNA vaccine, [place], 30 MCG/0.3ML injection INJECT AS DIRECTED 05/05/20 05/05/21  Carlyle Basques, MD    Not on File  Patient Active Problem List   Diagnosis Date Noted  . Cancer ([place]) 09/15/2018  . Hyperlipidemia 12/02/2017  . Primary osteoarthritis of right knee 12/02/2017  . Essential hypertension, benign 11/13/2017  . Chronic pain of right knee 11/13/2017  . CAD (coronary artery disease) 10/07/2016  . Glaucoma 11/13/2015  . Osteopenia 09/05/2015  . CKD (chronic kidney disease) stage 3, GFR 30-59 ml/min ([facility]) 12/13/2014  . History of noncompliance with medical treatment, presenting hazards to health 09/15/2014  . Pain medication agreement 12/15/2013  . Allergic rhinitis 11/22/2011  . Hypertension goal BP (blood pressure) < 140/80 11/22/2011  . Hx of colonic polyp 11/22/2011  . Palpitation 11/22/2011    Past Medical History:  Diagnosis Date  . 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 Chronic pain of right knee   . H/O  myocardial infarction, greater than 8 weeks 1986   reports normal NM Scan  . HTN (hypertension)   . Hyperlipidemia     Past Surgical History:  Procedure Laterality Date  . BREAST SURGERY     BILATERAL    Social History   Socioeconomic History  . Marital status: Widowed    Spouse name: Not on file  . Number of children: Not on file  . Years of education: Not on file  . Highest education level: Not on file  Occupational History  . Not on file  Tobacco Use  . Smoking status: Never Smoker  . Smokeless tobacco: Never Used  Vaping Use  . Vaping Use: Never used  Substance and Sexual Activity  . Alcohol use: Yes    Comment:  OCCASIONALLY  . Drug use: Not Currently  . Sexual activity: Not Currently  Other Topics Concern  . Not on file  Social History Narrative  . Not on file   Social Determinants of Health   Financial Resource Strain: Not on file  Food Insecurity: Not on file  Transportation Needs: Not on file  Physical Activity: Not on file  Stress: Not on file  Social Connections: Not on file  Intimate Partner Violence: Not on file    Family History  Problem Relation Age of Onset  . Healthy Daughter   . Lymphoma Son      Review of Systems  Constitutional: Negative.  Negative for chills and fever.  HENT: Negative.  Negative for congestion and sore throat.   Respiratory: Negative.  Negative for cough and shortness of breath.   Cardiovascular: Negative.  Negative for chest pain and palpitations.  Gastrointestinal: Negative.  Negative for abdominal pain, diarrhea, nausea and vomiting.  Genitourinary: Negative.  Negative for dysuria and hematuria.  Musculoskeletal: Positive for joint pain (Chronic knee pain). Negative for back pain and myalgias.  Skin: Negative.  Negative for rash.  Neurological: Negative for dizziness and headaches.  Endo/Heme/Allergies: Positive for environmental allergies.  All other systems reviewed and are negative.    Today's Vitals   09/26/20 1109 09/26/20 1132  BP: (!) 150/78 138/78  Pulse: 74   Temp: 98.2 F (36.8 C)   TempSrc: Oral   SpO2: 96%   Weight: 143 lb 3.2 oz (65 kg)   Height: 5' (1.524 m)    Body mass index is 27.97 kg/m.   Physical Exam Constitutional:      Appearance: Normal appearance.  HENT:     Head: Normocephalic.     Mouth/Throat:     Mouth: Mucous membranes are moist.     Pharynx: Oropharynx is clear.  Eyes:     Extraocular Movements: Extraocular movements intact.     Conjunctiva/sclera: Conjunctivae normal.     Pupils: Pupils are equal, round, and reactive to light.  Cardiovascular:     Rate and Rhythm: Normal rate and regular  rhythm.     Pulses: Normal pulses.     Heart sounds: Normal heart sounds.  Pulmonary:     Effort: Pulmonary effort is normal.     Breath sounds: Normal breath sounds.  Abdominal:     Palpations: Abdomen is soft.     Tenderness: There is no abdominal tenderness.  Musculoskeletal:        General: Normal range of motion.     Cervical back: Normal range of motion and neck supple.     Right lower leg: No edema.     Left lower leg: No edema.  Skin:  General: Skin is warm and dry.     Capillary Refill: Capillary refill takes less than 2 seconds.  Neurological:     General: No focal deficit present.     Mental Status: She is alert and oriented to person, place, and time.  Psychiatric:        Mood and Affect: Mood normal.        Behavior: Behavior normal.      ASSESSMENT & PLAN: Lyndy was seen today for hypertension and medication refill.  Diagnoses and all orders for this visit:  Essential hypertension, benign -     Comprehensive metabolic panel  Dyslipidemia -     Lipid panel  Chronic pain of right knee -     traMADol (ULTRAM) 50 MG tablet; Take 1 tablet (50 mg total) by mouth every 12 (twelve) hours as needed.  Seasonal allergies -     montelukast (SINGULAIR) 10 MG tablet; Take 1 tablet (10 mg total) by mouth at bedtime.    Patient Instructions   Health Maintenance After Age 52 After age 55, you are at a higher risk for certain long-term diseases and infections as well as injuries from falls. Falls are a major cause of broken bones and head injuries in people who are older than age 76. Getting regular preventive care can help to keep you healthy and well. Preventive care includes getting regular testing and making lifestyle changes as recommended by your health care provider. Talk with your health care provider about:  Which screenings and tests you should have. A screening is a test that checks for a disease when you have no symptoms.  A diet and exercise plan that is  right for you. What should I know about screenings and tests to prevent falls? Screening and testing are the best ways to find a health problem early. Early diagnosis and treatment give you the best chance of managing medical conditions that are common after age 55. Certain conditions and lifestyle choices may make you more likely to have a fall. Your health care provider may recommend:  Regular vision checks. Poor vision and conditions such as cataracts can make you more likely to have a fall. If you wear glasses, make sure to get your prescription updated if your vision changes.  Medicine review. Work with your health care provider to regularly review all of the medicines you are taking, including over-the-counter medicines. Ask your health care provider about any side effects that may make you more likely to have a fall. Tell your health care provider if any medicines that you take make you feel dizzy or sleepy.  Osteoporosis screening. Osteoporosis is a condition that causes the bones to get weaker. This can make the bones weak and cause them to break more easily.  Blood pressure screening. Blood pressure changes and medicines to control blood pressure can make you feel dizzy.  Strength and balance checks. Your health care provider may recommend certain tests to check your strength and balance while standing, walking, or changing positions.  Foot health exam. Foot pain and numbness, as well as not wearing proper footwear, can make you more likely to have a fall.  Depression screening. You may be more likely to have a fall if you have a fear of falling, feel emotionally low, or feel unable to do activities that you used to do.  Alcohol use screening. Using too much alcohol can affect your balance and may make you more likely to have a fall. What actions can  I take to lower my risk of falls? General instructions  Talk with your health care provider about your risks for falling. Tell your  health care provider if: ? You fall. Be sure to tell your health care provider about all falls, even ones that seem minor. ? You feel dizzy, sleepy, or off-balance.  Take over-the-counter and prescription medicines only as told by your health care provider. These include any supplements.  Eat a healthy diet and maintain a healthy weight. A healthy diet includes low-fat dairy products, low-fat (lean) meats, and fiber from whole grains, beans, and lots of fruits and vegetables. Home safety  Remove any tripping hazards, such as rugs, cords, and clutter.  Install safety equipment such as grab bars in bathrooms and safety rails on stairs.  Keep rooms and walkways well-lit. Activity  Follow a regular exercise program to stay fit. This will help you maintain your balance. Ask your health care provider what types of exercise are appropriate for you.  If you need a cane or walker, use it as recommended by your health care provider.  Wear supportive shoes that have nonskid soles.   Lifestyle  Do not drink alcohol if your health care provider tells you not to drink.  If you drink alcohol, limit how much you have: ? 0-1 drink a day for women. ? 0-2 drinks a day for men.  Be aware of how much alcohol is in your drink. In the [place] one drink equals one typical bottle of beer (12 oz), one-half glass of wine (5 oz), or one shot of hard liquor (1 oz).  Do not use any products that contain nicotine or tobacco, such as cigarettes and e-cigarettes. If you need help quitting, ask your health care provider. Summary  Having a healthy lifestyle and getting preventive care can help to protect your health and wellness after age 42.  Screening and testing are the best way to find a health problem early and help you avoid having a fall. Early diagnosis and treatment give you the best chance for managing medical conditions that are more common for people who are older than age 74.  Falls are a major cause  of broken bones and head injuries in people who are older than age 60. Take precautions to prevent a fall at home.  Work with your health care provider to learn what changes you can make to improve your health and wellness and to prevent falls. This information is not intended to replace advice given to you by your health care provider. Make sure you discuss any questions you have with your health care provider. Document Revised: 10/01/2018 Document Reviewed: 04/23/2017 Elsevier Patient Education  2021 [place], MD [facility]

## 2020-11-01 ENCOUNTER — Telehealth: Payer: Self-pay | Admitting: Emergency Medicine

## 2020-11-01 NOTE — Telephone Encounter (Signed)
LVM for pt to rtn my call to r/s appt with nha. Please schedule appt if pt calls the office.

## 2021-02-19 IMAGING — DX DG TIBIA/FIBULA 2V*L*
2 series · 2 of 2 positions shown · non-contrast
Comparison: None.

CLINICAL DATA: Leg pain

EXAM:
LEFT TIBIA AND FIBULA - 2 VIEW

[tibia ap]
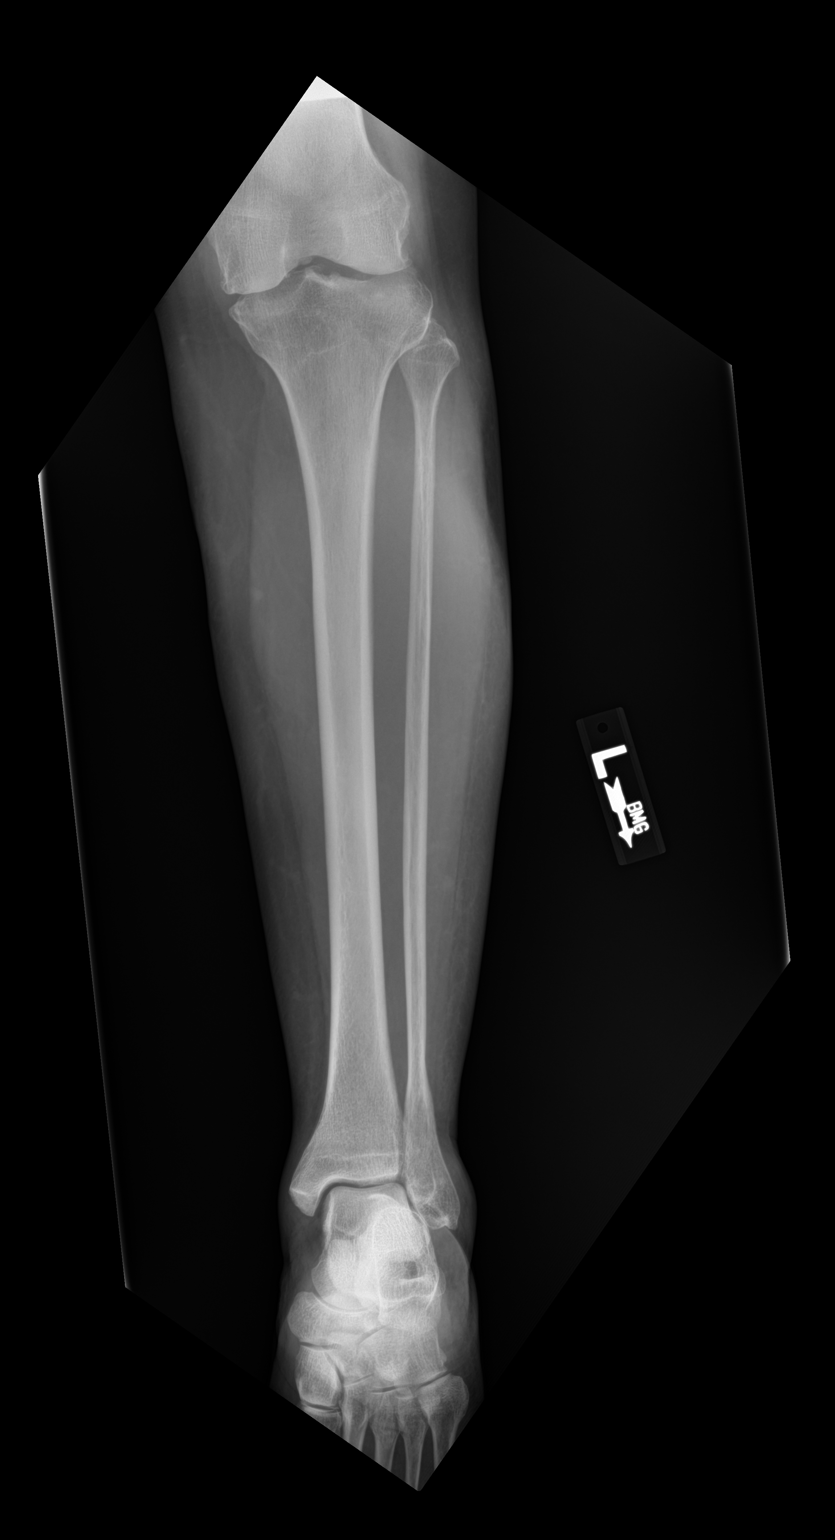

[tibia lat]
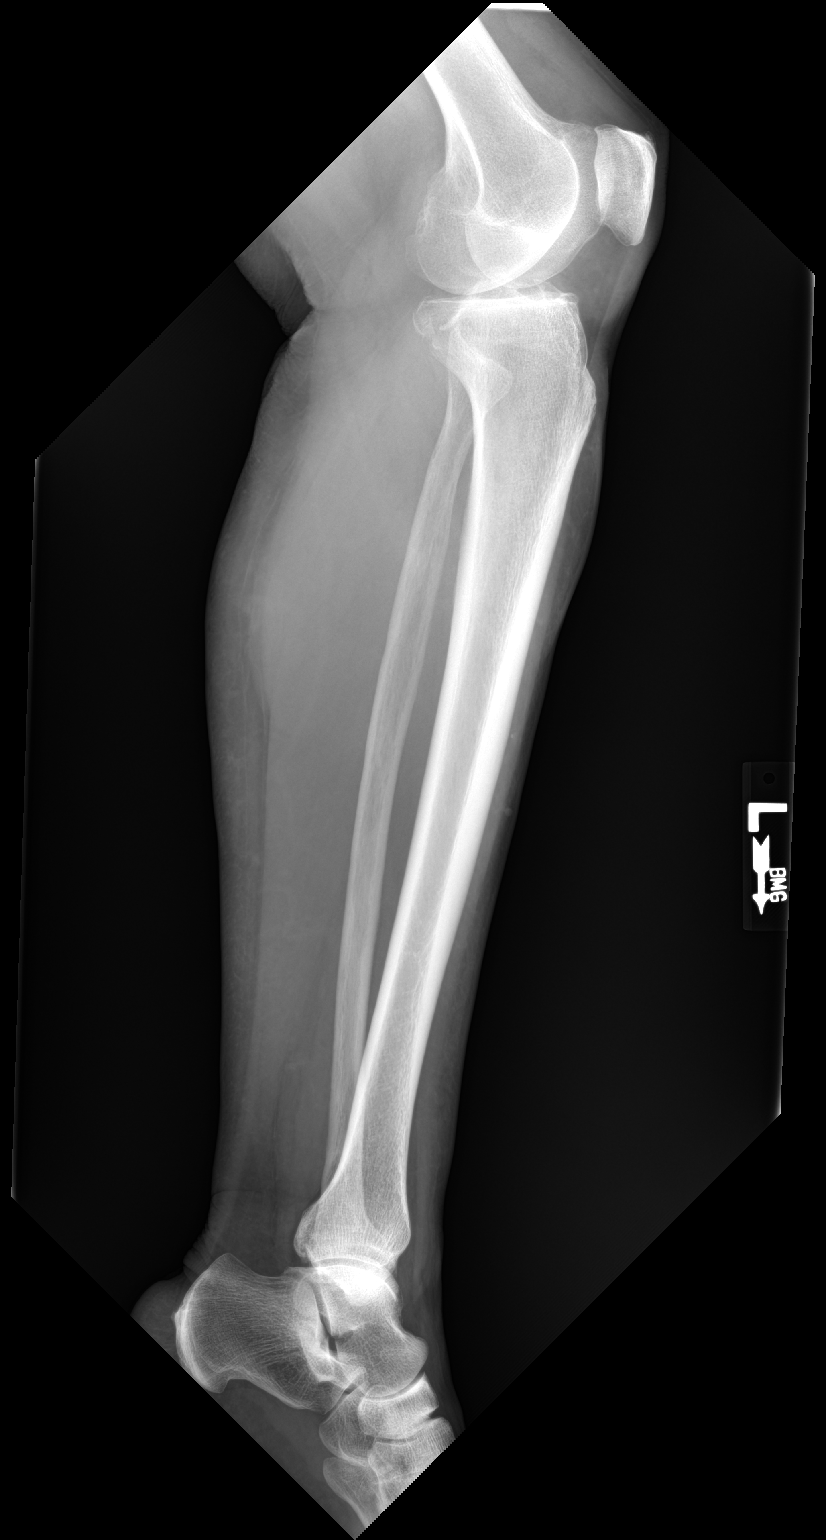

[2 of 2 positions shown; findings below may reference images not displayed]

FINDINGS: On the lateral view, there appears to be a nondisplaced fracture of
the posterior proximal tibia which appears acute. Correlate with
pain in this area. No joint effusion identified. No other fracture
in the tibia or fibula. Negative ankle.
IMPRESSION: Probable acute fracture posterior proximal tibia involving the knee
joint. Correlate with physical exam.

## 2021-03-23 ENCOUNTER — Ambulatory Visit: Payer: Medicare HMO

## 2021-03-29 ENCOUNTER — Other Ambulatory Visit: Payer: Self-pay

## 2021-03-29 ENCOUNTER — Ambulatory Visit (INDEPENDENT_AMBULATORY_CARE_PROVIDER_SITE_OTHER): Payer: Medicare HMO

## 2021-03-29 VITALS — BP 124/60 | HR 100 | Temp 98.0°F | Ht 60.0 in | Wt 137.8 lb

## 2021-03-29 DIAGNOSIS — Z0000 Encounter for general adult medical examination without abnormal findings: Secondary | ICD-10-CM

## 2021-03-29 NOTE — Patient Instructions (Signed)
Gina Ball , Thank you for taking time to come for your Medicare Wellness Visit. I appreciate your ongoing commitment to your health goals. Please review the following plan we discussed and let me know if I can assist you in the future.   Screening recommendations/referrals: Colonoscopy: never done Mammogram: 04/28/2017 Bone Density: 09/10/2015 Recommended yearly ophthalmology/optometry visit for glaucoma screening and checkup Recommended yearly dental visit for hygiene and checkup  Vaccinations: Influenza vaccine: declined Pneumococcal vaccine: 04/13/2010, 04/03/2015 Tdap vaccine: 04/18/2010; due every 10 years (overdue) Shingles vaccine: never done   Covid-19:09/09/2019, 10/10/2019, 05/05/2020  Advanced directives: Please bring a copy of your health care power of attorney and living will to the office at your convenience.  Conditions/risks identified: Yes; Client understands the importance of follow-up with providers by attending scheduled visits and discussed goals to eat healthier, increase physical activity, exercise the brain, socialize more, get enough sleep and make time for laughter.  Next appointment: Please schedule your next Medicare Wellness Visit with your Nurse Health Advisor in 1 year by calling [phone].   Preventive Care 45 Years and Older, Female Preventive care refers to lifestyle choices and visits with your health care provider that can promote health and wellness. What does preventive care include? A yearly physical exam. This is also called an annual well check. Dental exams once or twice a year. Routine eye exams. Ask your health care provider how often you should have your eyes checked. Personal lifestyle choices, including: Daily care of your teeth and gums. Regular physical activity. Eating a healthy diet. Avoiding tobacco and drug use. Limiting alcohol use. Practicing safe sex. Taking low-dose aspirin every day. Taking vitamin and mineral  supplements as recommended by your health care provider. What happens during an annual well check? The services and screenings done by your health care provider during your annual well check will depend on your age, overall health, lifestyle risk factors, and family history of disease. Counseling  Your health care provider may ask you questions about your: Alcohol use. Tobacco use. Drug use. Emotional well-being. Home and relationship well-being. Sexual activity. Eating habits. History of falls. Memory and ability to understand (cognition). Work and work Statistician. Reproductive health. Screening  You may have the following tests or measurements: Height, weight, and BMI. Blood pressure. Lipid and cholesterol levels. These may be checked every 5 years, or more frequently if you are over 76 years old. Skin check. Lung cancer screening. You may have this screening every year starting at age 1 if you have a 30-pack-year history of smoking and currently smoke or have quit within the past 15 years. Fecal occult blood test (FOBT) of the stool. You may have this test every year starting at age 64. Flexible sigmoidoscopy or colonoscopy. You may have a sigmoidoscopy every 5 years or a colonoscopy every 10 years starting at age 33. Hepatitis C blood test. Hepatitis B blood test. Sexually transmitted disease (STD) testing. Diabetes screening. This is done by checking your blood sugar (glucose) after you have not eaten for a while (fasting). You may have this done every 1-3 years. Bone density scan. This is done to screen for osteoporosis. You may have this done starting at age 54. Mammogram. This may be done every 1-2 years. Talk to your health care provider about how often you should have regular mammograms. Talk with your health care provider about your test results, treatment options, and if necessary, the need for more tests. Vaccines  Your health care provider may recommend certain  vaccines, such as: Influenza vaccine. This is recommended every year. Tetanus, diphtheria, and acellular pertussis (Tdap, Td) vaccine. You may need a Td booster every 10 years. Zoster vaccine. You may need this after age 60. Pneumococcal 13-valent conjugate (PCV13) vaccine. One dose is recommended after age 58. Pneumococcal polysaccharide (PPSV23) vaccine. One dose is recommended after age 98. Talk to your health care provider about which screenings and vaccines you need and how often you need them. This information is not intended to replace advice given to you by your health care provider. Make sure you discuss any questions you have with your health care provider. Document Released: 07/07/2015 Document Revised: 02/28/2016 Document Reviewed: 04/11/2015 Elsevier Interactive Patient Education  2017 [place] Prevention in the Home Falls can cause injuries. They can happen to people of all ages. There are many things you can do to make your home safe and to help prevent falls. What can I do on the outside of my home? Regularly fix the edges of walkways and driveways and fix any cracks. Remove anything that might make you trip as you walk through a door, such as a raised step or threshold. Trim any bushes or trees on the path to your home. Use bright outdoor lighting. Clear any walking paths of anything that might make someone trip, such as rocks or tools. Regularly check to see if handrails are loose or broken. Make sure that both sides of any steps have handrails. Any raised decks and porches should have guardrails on the edges. Have any leaves, snow, or ice cleared regularly. Use sand or salt on walking paths during winter. Clean up any spills in your garage right away. This includes oil or grease spills. What can I do in the bathroom? Use night lights. Install grab bars by the toilet and in the tub and shower. Do not use towel bars as grab bars. Use non-skid mats or decals in  the tub or shower. If you need to sit down in the shower, use a plastic, non-slip stool. Keep the floor dry. Clean up any water that spills on the floor as soon as it happens. Remove soap buildup in the tub or shower regularly. Attach bath mats securely with double-sided non-slip rug tape. Do not have throw rugs and other things on the floor that can make you trip. What can I do in the bedroom? Use night lights. Make sure that you have a light by your bed that is easy to reach. Do not use any sheets or blankets that are too big for your bed. They should not hang down onto the floor. Have a firm chair that has side arms. You can use this for support while you get dressed. Do not have throw rugs and other things on the floor that can make you trip. What can I do in the kitchen? Clean up any spills right away. Avoid walking on wet floors. Keep items that you use a lot in easy-to-reach places. If you need to reach something above you, use a strong step stool that has a grab bar. Keep electrical cords out of the way. Do not use floor polish or wax that makes floors slippery. If you must use wax, use non-skid floor wax. Do not have throw rugs and other things on the floor that can make you trip. What can I do with my stairs? Do not leave any items on the stairs. Make sure that there are handrails on both sides of the stairs and  use them. Fix handrails that are broken or loose. Make sure that handrails are as long as the stairways. Check any carpeting to make sure that it is firmly attached to the stairs. Fix any carpet that is loose or worn. Avoid having throw rugs at the top or bottom of the stairs. If you do have throw rugs, attach them to the floor with carpet tape. Make sure that you have a light switch at the top of the stairs and the bottom of the stairs. If you do not have them, ask someone to add them for you. What else can I do to help prevent falls? Wear shoes that: Do not have high  heels. Have rubber bottoms. Are comfortable and fit you well. Are closed at the toe. Do not wear sandals. If you use a stepladder: Make sure that it is fully opened. Do not climb a closed stepladder. Make sure that both sides of the stepladder are locked into place. Ask someone to hold it for you, if possible. Clearly mark and make sure that you can see: Any grab bars or handrails. First and last steps. Where the edge of each step is. Use tools that help you move around (mobility aids) if they are needed. These include: Canes. Walkers. Scooters. Crutches. Turn on the lights when you go into a dark area. Replace any light bulbs as soon as they burn out. Set up your furniture so you have a clear path. Avoid moving your furniture around. If any of your floors are uneven, fix them. If there are any pets around you, be aware of where they are. Review your medicines with your doctor. Some medicines can make you feel dizzy. This can increase your chance of falling. Ask your doctor what other things that you can do to help prevent falls. This information is not intended to replace advice given to you by your health care provider. Make sure you discuss any questions you have with your health care provider. Document Released: 04/06/2009 Document Revised: 11/16/2015 Document Reviewed: 07/15/2014 Elsevier Interactive Patient Education  2017 [employer].

## 2021-03-29 NOTE — Progress Notes (Signed)
Subjective:   Gina Ball is a 75 y.o. female who presents for Medicare Annual (Subsequent) preventive examination.  Review of Systems     Cardiac Risk Factors include: advanced age (>61men, >69 women);dyslipidemia;hypertension     Objective:    Today's Vitals   03/29/21 1432  BP: 124/60  Pulse: 100  Temp: 98 F (36.7 C)  SpO2: 97%  Weight: 137 lb 12.8 oz (62.5 kg)  Height: 5' (1.524 m)  PainSc: 0-No pain   Body mass index is 26.91 kg/m.  Advanced Directives 03/29/2021 03/22/2020 03/22/2019 08/17/2018  Does Patient Have a Medical Advance Directive? Yes Yes No No  Type of Advance Directive Living will;Healthcare Power of [place] - -  Does patient want to make changes to medical advance directive? No - Patient declined - - -  Copy of [place] in Chart? No - copy requested No - copy requested - -  Would patient like information on creating a medical advance directive? - - No - Patient declined Yes (ED - Information included in AVS)    Current Medications (verified) Outpatient Encounter Medications as of 03/29/2021  Medication Sig   Ascorbic Acid (VITAMIN C) 1000 MG tablet Take 1,000 mg by mouth daily.   aspirin EC 81 MG tablet Take 81 mg by mouth daily.   COVID-19 mRNA vaccine, Pfizer, 30 MCG/0.3ML injection INJECT AS DIRECTED   Cyanocobalamin (VITAMIN B 12 PO) Take 100 mg by mouth daily.   hydrochlorothiazide (HYDRODIURIL) 25 MG tablet Take 1 tablet (25 mg total) by mouth daily.   lisinopril (ZESTRIL) 40 MG tablet Take 1 tablet (40 mg total) by mouth daily.   montelukast (SINGULAIR) 10 MG tablet Take 1 tablet (10 mg total) by mouth at bedtime.   Multiple Vitamin (MULTIVITAMIN) tablet Take 1 tablet by mouth daily.   rosuvastatin (CRESTOR) 10 MG tablet Take 1 tablet (10 mg total) by mouth daily.   traMADol (ULTRAM) 50 MG tablet Take 1 tablet (50 mg total) by mouth every 12 (twelve) hours as needed.   Vitamin D,  Cholecalciferol, 25 MCG (1000 UT) TABS Take by mouth daily.   Zinc 50 MG CAPS Take by mouth daily.   No facility-administered encounter medications on file as of 03/29/2021.    Allergies (verified) Patient has no known allergies.   History: Past Medical History:  Diagnosis Date   Cancer ([place])    HAD BREST CANCER 1977   Chronic pain of right knee    H/O myocardial infarction, greater than 8 weeks 1986   reports normal NM Scan   HTN (hypertension)    Hyperlipidemia    Past Surgical History:  Procedure Laterality Date   BREAST SURGERY     BILATERAL   Family History  Problem Relation Age of Onset   Healthy Daughter    Lymphoma Son    Social History   Socioeconomic History   Marital status: Widowed    Spouse name: Not on file   Number of children: Not on file   Years of education: Not on file   Highest education level: Not on file  Occupational History   Not on file  Tobacco Use   Smoking status: Never   Smokeless tobacco: Never  Vaping Use   Vaping Use: Never used  Substance and Sexual Activity   Alcohol use: Yes    Comment: OCCASIONALLY   Drug use: Not Currently   Sexual activity: Not Currently  Other Topics Concern   Not on file  Social History Narrative   Not on file   Social Determinants of Health   Financial Resource Strain: Low Risk    Difficulty of Paying Living Expenses: Not hard at all  Food Insecurity: No Food Insecurity   Worried About Charity fundraiser in the Last Year: Never true   Arboriculturist in the Last Year: Never true  Transportation Needs: No Transportation Needs   Lack of Transportation (Medical): No   Lack of Transportation (Non-Medical): No  Physical Activity: Sufficiently Active   Days of Exercise per Week: 7 days   Minutes of Exercise per Session: 30 min  Stress: No Stress Concern Present   Feeling of Stress : Not at all  Social Connections: Moderately Integrated   Frequency of Communication with Friends and Family: More  than three times a week   Frequency of Social Gatherings with Friends and Family: More than three times a week   Attends Religious Services: More than 4 times per year   Active Member of [employer] or Organizations: Yes   Attends Archivist Meetings: More than 4 times per year   Marital Status: Widowed    Tobacco Counseling Counseling given: Not Answered   Clinical Intake:  Pre-visit preparation completed: Yes  Pain : No/denies pain Pain Score: 0-No pain     BMI - recorded: 26.91 Nutritional Status: BMI 25 -29 Overweight Nutritional Risks: Other (Comment) Diabetes: No  How often do you need to have someone help you when you read instructions, pamphlets, or other written materials from your doctor or pharmacy?: 1 - Never What is the last grade level you completed in school?: 8th grade; [employer]  Diabetic? no  Interpreter Needed?: No  Information entered by :: Lisette Abu, LPN   Activities of Daily Living In your present state of health, do you have any difficulty performing the following activities: 03/29/2021  Hearing? N  Vision? N  Difficulty concentrating or making decisions? N  Walking or climbing stairs? N  Dressing or bathing? N  Doing errands, shopping? N  Preparing Food and eating ? N  Using the Toilet? N  In the past six months, have you accidently leaked urine? N  Do you have problems with loss of bowel control? N  Managing your Medications? N  Managing your Finances? N  Housekeeping or managing your Housekeeping? N  Some recent data might be hidden    Patient Care Team: Horald Pollen, MD as PCP - General (Internal Medicine)  Indicate any recent Medical Services you may have received from other than [facility] providers in the past year (date may be approximate).     Assessment:   This is a routine wellness examination for [place].  Hearing/Vision screen Hearing Screening - Comments:: Patient denied any hearing difficulty.    No hearing aids.  Vision Screening - Comments:: Patient wears corrective glasses/contacts.   Dietary issues and exercise activities discussed: Current Exercise Habits: Home exercise routine, Type of exercise: walking, Time (Minutes): 30, Frequency (Times/Week): 7, Weekly Exercise (Minutes/Week): 210, Intensity: Moderate, Exercise limited by: None identified   Goals Addressed             This Visit's Progress    Patient Stated       To maintain my current health status by continuing to eat healthy, stay physically active and socially active.      Depression Screen PHQ 2/9 Scores 03/29/2021 09/26/2020 06/21/2020 03/22/2020 03/22/2019 02/25/2019 09/22/2018  PHQ - 2 Score 0 0  0 0 0 0 0    Fall Risk Fall Risk  03/29/2021 09/26/2020 06/21/2020 03/22/2020 03/22/2019  Falls in the past year? 0 0 0 1 0  Number falls in past yr: 0 0 - 0 0  Comment - - - icey steps missed last step/ -  Injury with Fall? 0 - - 0 0  Risk for fall due to : No Fall Risks No Fall Risks - - -  Follow up Falls evaluation completed Falls evaluation completed Falls evaluation completed Falls evaluation completed;Education provided Falls evaluation completed;Education provided;Falls prevention discussed    FALL RISK PREVENTION PERTAINING TO THE HOME:  Any stairs in or around the home? Yes  If so, are there any without handrails? No  Home free of loose throw rugs in walkways, pet beds, electrical cords, etc? Yes  Adequate lighting in your home to reduce risk of falls? Yes   ASSISTIVE DEVICES UTILIZED TO PREVENT FALLS:  Life alert? Yes  (Apple watch) Use of a cane, walker or w/c? No  Grab bars in the bathroom? No  Shower chair or bench in shower? No  Elevated toilet seat or a handicapped toilet? No   TIMED UP AND GO:  Was the test performed? Yes .  Length of time to ambulate 10 feet: 6 sec.   Gait steady and fast without use of assistive device  Cognitive Function: Normal cognitive status assessed by direct  observation by this Nurse Health Advisor. No abnormalities found.       6CIT Screen 03/22/2020 03/22/2019  What Year? 0 points 0 points  What month? 0 points 0 points  What time? 0 points 0 points  Count back from 20 0 points 0 points  Months in reverse 0 points 0 points  Repeat phrase 0 points 0 points  Total Score 0 0    Immunizations Immunization History  Administered Date(s) Administered   Influenza, Seasonal, Injecte, Preservative Fre 04/03/2015   Influenza,inj,quad, With Preservative 03/05/2012   Influenza-Unspecified 05/08/2005, 05/08/2006, 06/13/2008, 04/24/2009, 04/13/2010   PFIZER(Purple Top)SARS-COV-2 Vaccination 09/09/2019, 10/10/2019, 05/05/2020   Pneumococcal Conjugate-13 04/03/2015   Pneumococcal Polysaccharide-23 04/13/2010   Tdap 04/18/2010    TDAP status: Due, Education has been provided regarding the importance of this vaccine. Advised may receive this vaccine at local pharmacy or Health Dept. Aware to provide a copy of the vaccination record if obtained from local pharmacy or Health Dept. Verbalized acceptance and understanding.  Flu Vaccine status: Declined, Education has been provided regarding the importance of this vaccine but patient still declined. Advised may receive this vaccine at local pharmacy or Health Dept. Aware to provide a copy of the vaccination record if obtained from local pharmacy or Health Dept. Verbalized acceptance and understanding.  Pneumococcal vaccine status: Up to date  Covid-19 vaccine status: Completed vaccines  Qualifies for Shingles Vaccine? Yes   Zostavax completed No   Shingrix Completed?: No.    Education has been provided regarding the importance of this vaccine. Patient has been advised to call insurance company to determine out of pocket expense if they have not yet received this vaccine. Advised may also receive vaccine at local pharmacy or Health Dept. Verbalized acceptance and understanding.  Screening Tests Health  Maintenance  Topic Date Due   Hepatitis C Screening  Never done   Zoster Vaccines- Shingrix (1 of 2) Never done   COVID-19 Vaccine (4 - Booster for Pfizer series) 07/28/2020   INFLUENZA VACCINE  01/22/2021   TETANUS/TDAP  06/21/2021 (Originally 04/18/2020)   DEXA  SCAN  Completed   HPV VACCINES  Aged Out    Health Maintenance  Health Maintenance Due  Topic Date Due   Hepatitis C Screening  Never done   Zoster Vaccines- Shingrix (1 of 2) Never done   COVID-19 Vaccine (4 - Booster for Pfizer series) 07/28/2020   INFLUENZA VACCINE  01/22/2021    Colorectal cancer screening: No longer required.   Mammogram status: No longer required due to having breast removed.  Bone Density status: Completed 09/10/2015. Results reflect: Bone density results: OSTEOPENIA. Repeat every 2-3 years.  Lung Cancer Screening: (Low Dose CT Chest recommended if Age 67-80 years, 30 pack-year currently smoking OR have quit w/in 15years.) does not qualify.   Lung Cancer Screening Referral: no  Additional Screening:   Hepatitis C Screening: does qualify; Completed no  Vision Screening: Recommended annual ophthalmology exams for early detection of glaucoma and other disorders of the eye. Is the patient up to date with their annual eye exam?  No  Who is the provider or what is the name of the office in which the patient attends annual eye exams? Patient looking for optometrist If pt is not established with a provider, would they like to be referred to a provider to establish care? No .   Dental Screening: Recommended annual dental exams for proper oral hygiene  Community Resource Referral / Chronic Care Management: CRR required this visit?  No   CCM required this visit?  No      Plan:     I have personally reviewed and noted the following in the patient's chart:   Medical and social history Use of alcohol, tobacco or illicit drugs  Current medications and supplements including opioid  prescriptions.  Functional ability and status Nutritional status Physical activity Advanced directives List of other physicians Hospitalizations, surgeries, and ER visits in previous 12 months Vitals Screenings to include cognitive, depression, and falls Referrals and appointments  In addition, I have reviewed and discussed with patient certain preventive protocols, quality metrics, and best practice recommendations. A written personalized care plan for preventive services as well as general preventive health recommendations were provided to patient.     Sheral Flow, LPN   [id number]   Nurse Notes:  Hearing Screening - Comments:: Patient denied any hearing difficulty.   No hearing aids.  Vision Screening - Comments:: Patient wears corrective glasses/contacts.

## 2021-04-03 ENCOUNTER — Ambulatory Visit (INDEPENDENT_AMBULATORY_CARE_PROVIDER_SITE_OTHER): Payer: Medicare HMO | Admitting: Emergency Medicine

## 2021-04-03 ENCOUNTER — Encounter: Payer: Self-pay | Admitting: Emergency Medicine

## 2021-04-03 ENCOUNTER — Other Ambulatory Visit: Payer: Self-pay

## 2021-04-03 DIAGNOSIS — I10 Essential (primary) hypertension: Secondary | ICD-10-CM | POA: Diagnosis not present

## 2021-04-03 DIAGNOSIS — G8929 Other chronic pain: Secondary | ICD-10-CM | POA: Diagnosis not present

## 2021-04-03 DIAGNOSIS — E785 Hyperlipidemia, unspecified: Secondary | ICD-10-CM | POA: Diagnosis not present

## 2021-04-03 DIAGNOSIS — M25561 Pain in right knee: Secondary | ICD-10-CM

## 2021-04-03 MED ORDER — TRAMADOL HCL 50 MG PO TABS: 50.0000 mg | ORAL_TABLET | Freq: Two times a day (BID) | ORAL | 2 refills | Status: DC | PRN

## 2021-04-03 NOTE — Assessment & Plan Note (Signed)
Diet and nutrition discussed.  Continue rosuvastatin 10 mg daily The 10-year ASCVD risk score (Arnett DK, et al., 2019) is: 21.2%   Values used to calculate the score:     Age: 75 years     Sex: Female     Is Non-Hispanic African American: No     Diabetic: No     Tobacco smoker: No     Systolic Blood Pressure: 588 mmHg     Is BP treated: Yes     HDL Cholesterol: 73.4 mg/dL     Total Cholesterol: 236 mg/dL

## 2021-04-03 NOTE — Progress Notes (Signed)
Gina Ball 75 y.o.   Chief Complaint  Patient presents with   Hypertension    HISTORY OF PRESENT ILLNESS: This is a 75 y.o. female with history of hypertension here for follow-up. Presently on lisinopril 40 mg and hydrochlorothiazide 25 mg daily. Also history of dyslipidemia on rosuvastatin 10 mg daily. Has history of occasional joint pains in particular knee pains for which she occasionally takes tramadol.  Has history of osteoarthritis of right knee. Overall doing well. Has no complaints or medical concerns today.  Hypertension Pertinent negatives include no chest pain, headaches, palpitations or shortness of breath.    Prior to Admission medications   Medication Sig Start Date End Date Taking? Authorizing Provider  Ascorbic Acid (VITAMIN C) 1000 MG tablet Take 1,000 mg by mouth daily.   Yes [provider]  aspirin EC 81 MG tablet Take 81 mg by mouth daily.   Yes [provider]  Cyanocobalamin (VITAMIN B 12 PO) Take 100 mg by mouth daily.   Yes [provider]  hydrochlorothiazide (HYDRODIURIL) 25 MG tablet Take 1 tablet (25 mg total) by mouth daily. 06/21/20  Yes Charmelle Soh, Ines Bloomer, MD  lisinopril (ZESTRIL) 40 MG tablet Take 1 tablet (40 mg total) by mouth daily. 06/21/20  Yes Shavaughn Seidl, Ines Bloomer, MD  montelukast (SINGULAIR) 10 MG tablet Take 1 tablet (10 mg total) by mouth at bedtime. 09/26/20  Yes Zanya Lindo, Ines Bloomer, MD  Multiple Vitamin (MULTIVITAMIN) tablet Take 1 tablet by mouth daily.   Yes [provider]  rosuvastatin (CRESTOR) 10 MG tablet Take 1 tablet (10 mg total) by mouth daily. 09/26/20  Yes Kingston Shawgo, Ines Bloomer, MD  traMADol (ULTRAM) 50 MG tablet Take 1 tablet (50 mg total) by mouth every 12 (twelve) hours as needed. 09/26/20  Yes Haralambos Yeatts, Ines Bloomer, MD  Vitamin D, Cholecalciferol, 25 MCG (1000 UT) TABS Take by mouth daily.   Yes [provider]  Zinc 50 MG CAPS Take by mouth daily.   Yes [provider]  COVID-19 mRNA vaccine, Pfizer, 30 MCG/0.3ML injection INJECT AS DIRECTED Patient not taking: Reported on 04/03/2021 05/05/20 05/05/21  Carlyle Basques, MD    No Known Allergies  Patient Active Problem List   Diagnosis Date Noted   Cancer [facility]) 09/15/2018   Dyslipidemia 12/02/2017   Primary osteoarthritis of right knee 12/02/2017   Essential hypertension, benign 11/13/2017   Chronic pain of right knee 11/13/2017   CAD (coronary artery disease) 10/07/2016   Glaucoma 11/13/2015   Osteopenia 09/05/2015   CKD (chronic kidney disease) stage 3, GFR 30-59 ml/min (HCC) 12/13/2014   History of noncompliance with medical treatment, presenting hazards to health 09/15/2014   Pain medication agreement 12/15/2013   Allergic rhinitis 11/22/2011   Hypertension goal BP (blood pressure) < 140/80 11/22/2011   Hx of colonic polyp 11/22/2011   Palpitation 11/22/2011    Past Medical History:  Diagnosis Date   Cancer ([place])    HAD BREST CANCER 1977   Chronic pain of right knee    H/O myocardial infarction, greater than 8 weeks 1986   reports normal NM Scan   HTN (hypertension)    Hyperlipidemia     Past Surgical History:  Procedure Laterality Date   BREAST SURGERY     BILATERAL    Social History   Socioeconomic History   Marital status: Widowed    Spouse name: Not on file   Number of children: Not on file   Years of education: Not on file   Highest  education level: Not on file  Occupational History   Not on file  Tobacco Use   Smoking status: Never   Smokeless tobacco: Never  Vaping Use   Vaping Use: Never used  Substance and Sexual Activity   Alcohol use: Yes    Comment: OCCASIONALLY   Drug use: Not Currently   Sexual activity: Not Currently  Other Topics Concern   Not on file  Social History Narrative   Not on file   Social Determinants of Health   Financial Resource Strain: Low Risk    Difficulty of Paying Living Expenses: Not hard at all  Food Insecurity: No  Food Insecurity   Worried About Charity fundraiser in the Last Year: Never true   [place] in the Last Year: Never true  Transportation Needs: No Transportation Needs   Lack of Transportation (Medical): No   Lack of Transportation (Non-Medical): No  Physical Activity: Sufficiently Active   Days of Exercise per Week: 7 days   Minutes of Exercise per Session: 30 min  Stress: No Stress Concern Present   Feeling of Stress : Not at all  Social Connections: Moderately Integrated   Frequency of Communication with Friends and Family: More than three times a week   Frequency of Social Gatherings with Friends and Family: More than three times a week   Attends Religious Services: More than 4 times per year   Active Member of [employer] or Organizations: Yes   Attends Archivist Meetings: More than 4 times per year   Marital Status: Widowed  Human resources officer Violence: Not At Risk   Fear of Current or Ex-Partner: No   Emotionally Abused: No   Physically Abused: No   Sexually Abused: No    Family History  Problem Relation Age of Onset   Healthy Daughter    Lymphoma Son      Review of Systems  Constitutional: Negative.  Negative for chills and fever.  HENT: Negative.  Negative for congestion and sore throat.   Respiratory: Negative.  Negative for cough and shortness of breath.   Cardiovascular: Negative.  Negative for chest pain and palpitations.  Gastrointestinal: Negative.  Negative for abdominal pain, nausea and vomiting.  Genitourinary: Negative.  Negative for dysuria.  Musculoskeletal:  Positive for joint pain.  Skin: Negative.  Negative for rash.  Neurological:  Negative for dizziness and headaches.  All other systems reviewed and are negative.  Today's Vitals   04/03/21 1045  BP: 140/78  Pulse: 79  Temp: 98.2 F (36.8 C)  TempSrc: Oral  SpO2: 96%  Weight: 138 lb (62.6 kg)  Height: 5' (1.524 m)   Body mass index is 26.95 kg/m.  Physical Exam Vitals  reviewed.  Constitutional:      Appearance: Normal appearance.  HENT:     Head: Normocephalic.  Eyes:     Extraocular Movements: Extraocular movements intact.     Pupils: Pupils are equal, round, and reactive to light.  Cardiovascular:     Rate and Rhythm: Normal rate and regular rhythm.     Pulses: Normal pulses.     Heart sounds: Normal heart sounds.  Pulmonary:     Effort: Pulmonary effort is normal.     Breath sounds: Normal breath sounds.  Musculoskeletal:     Cervical back: Normal range of motion and neck supple.  Skin:    General: Skin is warm and dry.     Capillary Refill: Capillary refill takes less than 2 seconds.  Neurological:     General: No focal deficit present.     Mental Status: She is alert and oriented to person, place, and time.  Psychiatric:        Mood and Affect: Mood normal.        Behavior: Behavior normal.     ASSESSMENT & PLAN: Problem List Items Addressed This Visit       Cardiovascular and Mediastinum   Essential hypertension, benign    Well-controlled hypertension.  Continue lisinopril 40 mg and hydrochlorothiazide 25 mg daily. BP Readings from Last 3 Encounters:  04/03/21 120/70  03/29/21 124/60  09/26/20 138/78  Dietary approaches to stop hypertension discussed. Follow-up in 6 months.         Other   Chronic pain of right knee    Stable.  Osteoarthritis of right knee.  Tramadol as needed.      Relevant Medications   traMADol (ULTRAM) 50 MG tablet   Dyslipidemia    Diet and nutrition discussed.  Continue rosuvastatin 10 mg daily The 10-year ASCVD risk score (Arnett DK, et al., 2019) is: 21.2%   Values used to calculate the score:     Age: 31 years     Sex: Female     Is Non-Hispanic African American: No     Diabetic: No     Tobacco smoker: No     Systolic Blood Pressure: 144 mmHg     Is BP treated: Yes     HDL Cholesterol: 73.4 mg/dL     Total Cholesterol: 236 mg/dL       Patient Instructions  Hypertension,  Adult High blood pressure (hypertension) is when the force of blood pumping through the arteries is too strong. The arteries are the blood vessels that carry blood from the heart throughout the body. Hypertension forces the heart to work harder to pump blood and may cause arteries to become narrow or stiff. Untreated or uncontrolled hypertension can cause a heart attack, heart failure, a stroke, kidney disease, and other problems. A blood pressure reading consists of a higher number over a lower number. Ideally, your blood pressure should be below 120/80. The first ("top") number is called the systolic pressure. It is a measure of the pressure in your arteries as your heart beats. The second ("bottom") number is called the diastolic pressure. It is a measure of the pressure in your arteries as the heart relaxes. What are the causes? The exact cause of this condition is not known. There are some conditions that result in or are related to high blood pressure. What increases the risk? Some risk factors for high blood pressure are under your control. The following factors may make you more likely to develop this condition: Smoking. Having type 2 diabetes mellitus, high cholesterol, or both. Not getting enough exercise or physical activity. Being overweight. Having too much fat, sugar, calories, or salt (sodium) in your diet. Drinking too much alcohol. Some risk factors for high blood pressure may be difficult or impossible to change. Some of these factors include: Having chronic kidney disease. Having a family history of high blood pressure. Age. Risk increases with age. Race. You may be at higher risk if you are African American. Gender. Men are at higher risk than women before age 97. After age 38, women are at higher risk than men. Having obstructive sleep apnea. Stress. What are the signs or symptoms? High blood pressure may not cause symptoms. Very high blood pressure (hypertensive crisis)  may cause: Headache. Anxiety.  Shortness of breath. Nosebleed. Nausea and vomiting. Vision changes. Severe chest pain. Seizures. How is this diagnosed? This condition is diagnosed by measuring your blood pressure while you are seated, with your arm resting on a flat surface, your legs uncrossed, and your feet flat on the floor. The cuff of the blood pressure monitor will be placed directly against the skin of your upper arm at the level of your heart. It should be measured at least twice using the same arm. Certain conditions can cause a difference in blood pressure between your right and left arms. Certain factors can cause blood pressure readings to be lower or higher than normal for a short period of time: When your blood pressure is higher when you are in a health care provider's office than when you are at home, this is called white coat hypertension. Most people with this condition do not need medicines. When your blood pressure is higher at home than when you are in a health care provider's office, this is called masked hypertension. Most people with this condition may need medicines to control blood pressure. If you have a high blood pressure reading during one visit or you have normal blood pressure with other risk factors, you may be asked to: Return on a different day to have your blood pressure checked again. Monitor your blood pressure at home for 1 week or longer. If you are diagnosed with hypertension, you may have other blood or imaging tests to help your health care provider understand your overall risk for other conditions. How is this treated? This condition is treated by making healthy lifestyle changes, such as eating healthy foods, exercising more, and reducing your alcohol intake. Your health care provider may prescribe medicine if lifestyle changes are not enough to get your blood pressure under control, and if: Your systolic blood pressure is above 130. Your diastolic  blood pressure is above 80. Your personal target blood pressure may vary depending on your medical conditions, your age, and other factors. Follow these instructions at home: Eating and drinking  Eat a diet that is high in fiber and potassium, and low in sodium, added sugar, and fat. An example eating plan is called the DASH (Dietary Approaches to Stop Hypertension) diet. To eat this way: Eat plenty of fresh fruits and vegetables. Try to fill one half of your plate at each meal with fruits and vegetables. Eat whole grains, such as whole-wheat pasta, brown rice, or whole-grain bread. Fill about one fourth of your plate with whole grains. Eat or drink low-fat dairy products, such as skim milk or low-fat yogurt. Avoid fatty cuts of meat, processed or cured meats, and poultry with skin. Fill about one fourth of your plate with lean proteins, such as fish, chicken without skin, beans, eggs, or tofu. Avoid pre-made and processed foods. These tend to be higher in sodium, added sugar, and fat. Reduce your daily sodium intake. Most people with hypertension should eat less than 1,500 mg of sodium a day. Do not drink alcohol if: Your health care provider tells you not to drink. You are pregnant, may be pregnant, or are planning to become pregnant. If you drink alcohol: Limit how much you use to: 0-1 drink a day for women. 0-2 drinks a day for men. Be aware of how much alcohol is in your drink. In the [place] one drink equals one 12 oz bottle of beer (355 mL), one 5 oz glass of wine (148 mL), or one 1 oz glass of  hard liquor (44 mL). Lifestyle  Work with your health care provider to maintain a healthy body weight or to lose weight. Ask what an ideal weight is for you. Get at least 30 minutes of exercise most days of the week. Activities may include walking, swimming, or biking. Include exercise to strengthen your muscles (resistance exercise), such as Pilates or lifting weights, as part of your weekly  exercise routine. Try to do these types of exercises for 30 minutes at least 3 days a week. Do not use any products that contain nicotine or tobacco, such as cigarettes, e-cigarettes, and chewing tobacco. If you need help quitting, ask your health care provider. Monitor your blood pressure at home as told by your health care provider. Keep all follow-up visits as told by your health care provider. This is important. Medicines Take over-the-counter and prescription medicines only as told by your health care provider. Follow directions carefully. Blood pressure medicines must be taken as prescribed. Do not skip doses of blood pressure medicine. Doing this puts you at risk for problems and can make the medicine less effective. Ask your health care provider about side effects or reactions to medicines that you should watch for. Contact a health care provider if you: Think you are having a reaction to a medicine you are taking. Have headaches that keep coming back (recurring). Feel dizzy. Have swelling in your ankles. Have trouble with your vision. Get help right away if you: Develop a severe headache or confusion. Have unusual weakness or numbness. Feel faint. Have severe pain in your chest or abdomen. Vomit repeatedly. Have trouble breathing. Summary Hypertension is when the force of blood pumping through your arteries is too strong. If this condition is not controlled, it may put you at risk for serious complications. Your personal target blood pressure may vary depending on your medical conditions, your age, and other factors. For most people, a normal blood pressure is less than 120/80. Hypertension is treated with lifestyle changes, medicines, or a combination of both. Lifestyle changes include losing weight, eating a healthy, low-sodium diet, exercising more, and limiting alcohol. This information is not intended to replace advice given to you by your health care provider. Make sure you  discuss any questions you have with your health care provider. Document Revised: 02/18/2018 Document Reviewed: 02/18/2018 Elsevier Patient Education  2022 [place], MD [facility]

## 2021-04-03 NOTE — Assessment & Plan Note (Signed)
Well-controlled hypertension.  Continue lisinopril 40 mg and hydrochlorothiazide 25 mg daily. BP Readings from Last 3 Encounters:  04/03/21 120/70  03/29/21 124/60  09/26/20 138/78  Dietary approaches to stop hypertension discussed. Follow-up in 6 months.

## 2021-04-03 NOTE — Patient Instructions (Signed)

## 2021-04-03 NOTE — Assessment & Plan Note (Signed)
Stable.  Osteoarthritis of right knee.  Tramadol as needed.

## 2021-06-29 ENCOUNTER — Other Ambulatory Visit: Payer: Self-pay | Admitting: Emergency Medicine

## 2021-06-29 DIAGNOSIS — I10 Essential (primary) hypertension: Secondary | ICD-10-CM

## 2021-06-29 DIAGNOSIS — E785 Hyperlipidemia, unspecified: Secondary | ICD-10-CM

## 2021-08-22 ENCOUNTER — Other Ambulatory Visit: Payer: Self-pay | Admitting: Emergency Medicine

## 2021-08-22 DIAGNOSIS — J302 Other seasonal allergic rhinitis: Secondary | ICD-10-CM

## 2021-09-06 ENCOUNTER — Other Ambulatory Visit: Payer: Self-pay | Admitting: Emergency Medicine

## 2021-10-02 ENCOUNTER — Other Ambulatory Visit: Payer: Self-pay | Admitting: Emergency Medicine

## 2021-10-02 ENCOUNTER — Ambulatory Visit (INDEPENDENT_AMBULATORY_CARE_PROVIDER_SITE_OTHER): Payer: Medicare HMO | Admitting: Emergency Medicine

## 2021-10-02 ENCOUNTER — Encounter: Payer: Self-pay | Admitting: Emergency Medicine

## 2021-10-02 VITALS — BP 120/72 | HR 75 | Temp 98.2°F | Ht 60.0 in

## 2021-10-02 DIAGNOSIS — M25561 Pain in right knee: Secondary | ICD-10-CM

## 2021-10-02 DIAGNOSIS — N1832 Chronic kidney disease, stage 3b: Secondary | ICD-10-CM

## 2021-10-02 DIAGNOSIS — I10 Essential (primary) hypertension: Secondary | ICD-10-CM

## 2021-10-02 DIAGNOSIS — E785 Hyperlipidemia, unspecified: Secondary | ICD-10-CM | POA: Diagnosis not present

## 2021-10-02 DIAGNOSIS — G8929 Other chronic pain: Secondary | ICD-10-CM | POA: Diagnosis not present

## 2021-10-02 DIAGNOSIS — J0100 Acute maxillary sinusitis, unspecified: Secondary | ICD-10-CM | POA: Diagnosis not present

## 2021-10-02 LAB — COMPREHENSIVE METABOLIC PANEL
ALT: 18 U/L (ref 0–35)
AST: 22 U/L (ref 0–37)
Albumin: 4.3 g/dL (ref 3.5–5.2)
Alkaline Phosphatase: 38 U/L — ABNORMAL LOW (ref 39–117)
BUN: 38 mg/dL — ABNORMAL HIGH (ref 6–23)
CO2: 22 mEq/L (ref 19–32)
Calcium: 9.5 mg/dL (ref 8.4–10.5)
Chloride: 108 mEq/L (ref 96–112)
Creatinine, Ser: 1.43 mg/dL — ABNORMAL HIGH (ref 0.40–1.20)
GFR: 35.56 mL/min — ABNORMAL LOW (ref >60.00)
Glucose, Bld: 91 mg/dL (ref 70–99)
Potassium: 3.9 mEq/L (ref 3.5–5.1)
Sodium: 138 mEq/L (ref 135–145)
Total Bilirubin: 0.5 mg/dL (ref 0.2–1.2)
Total Protein: 7.2 g/dL (ref 6.0–8.3)

## 2021-10-02 LAB — LIPID PANEL
Cholesterol: 155 mg/dL (ref 0–200)
HDL: 66.9 mg/dL (ref >39.00)
LDL Cholesterol: 69 mg/dL (ref 0–99)
NonHDL: 88.03
Total CHOL/HDL Ratio: 2
Triglycerides: 95 mg/dL (ref 0.0–149.0)
VLDL: 19 mg/dL (ref 0.0–40.0)

## 2021-10-02 MED ORDER — AMOXICILLIN-POT CLAVULANATE 875-125 MG PO TABS: 1.0000 | ORAL_TABLET | Freq: Two times a day (BID) | ORAL | 0 refills | Status: AC

## 2021-10-02 MED ORDER — TRAMADOL HCL 50 MG PO TABS: 50.0000 mg | ORAL_TABLET | Freq: Two times a day (BID) | ORAL | 2 refills | Status: DC | PRN

## 2021-10-02 NOTE — Patient Instructions (Signed)

## 2021-10-02 NOTE — Assessment & Plan Note (Signed)
Well-controlled hypertension.  Continue lisinopril 40 mg and hydrochlorothiazide 25 mg daily. ?BP Readings from Last 3 Encounters:  ?10/02/21 120/72  ?04/03/21 120/70  ?03/29/21 124/60  ? ? ?

## 2021-10-02 NOTE — Assessment & Plan Note (Signed)
Stable.  Continue rosuvastatin 10 mg daily. ?The 10-year ASCVD risk score (Arnett DK, et al., 2019) is: 21.2% ?  Values used to calculate the score: ?    Age: 76 years ?    Sex: Female ?    Is Non-Hispanic African American: No ?    Diabetic: No ?    Tobacco smoker: No ?    Systolic Blood Pressure: 270 mmHg ?    Is BP treated: Yes ?    HDL Cholesterol: 73.4 mg/dL ?    Total Cholesterol: 236 mg/dL ? ?

## 2021-10-02 NOTE — Progress Notes (Signed)
Reino Kent ?76 y.o. ? ? ?Chief Complaint  ?Patient presents with  ? Follow-up  ? Allergies  ?  Nasla drainage, congestion , eyes are sore, yellow mucus   ? ? ?HISTORY OF PRESENT ILLNESS: ?This is a 76 y.o. female complaining of sinus problems for the last 2 months progressively getting worse.  Complaining of sinus congestion and nasal drainage now turning purulent.  Occasional sinus headache.  Has history of recurrent allergies. ?Also has history of chronic bilateral knee pain and takes tramadol as needed for severe pain. ?History of hypertension and dyslipidemia.  Doing well. ?No other complaints or medical concerns today. ? ?HPI ? ? ?Prior to Admission medications   ?Medication Sig Start Date End Date Taking? Authorizing Provider  ?Ascorbic Acid (VITAMIN C) 1000 MG tablet Take 1,000 mg by mouth daily.   Yes [provider]  ?aspirin EC 81 MG tablet Take 81 mg by mouth daily.   Yes [provider]  ?Cyanocobalamin (VITAMIN B 12 PO) Take 100 mg by mouth daily.   Yes [provider]  ?hydrochlorothiazide (HYDRODIURIL) 25 MG tablet TAKE 1 TABLET EVERY DAY 06/30/21  Yes Lauriann Milillo, Ines Bloomer, MD  ?lisinopril (ZESTRIL) 40 MG tablet TAKE 1 TABLET EVERY DAY 06/30/21  Yes Sheril Hammond, Ines Bloomer, MD  ?montelukast (SINGULAIR) 10 MG tablet TAKE 1 TABLET AT BEDTIME 08/22/21  Yes Raesean Bartoletti, Ines Bloomer, MD  ?Multiple Vitamin (MULTIVITAMIN) tablet Take 1 tablet by mouth daily.   Yes [provider]  ?rosuvastatin (CRESTOR) 10 MG tablet TAKE 1 TABLET EVERY DAY 09/06/21  Yes Kahlani Graber, Ines Bloomer, MD  ?traMADol (ULTRAM) 50 MG tablet Take 1 tablet (50 mg total) by mouth every 12 (twelve) hours as needed. 04/03/21  Yes Malyiah Fellows, Ines Bloomer, MD  ?Vitamin D, Cholecalciferol, 25 MCG (1000 UT) TABS Take by mouth daily.   Yes [provider]  ?Zinc 50 MG CAPS Take by mouth daily.   Yes [provider]  ? ? ?No Known Allergies ? ?Patient Active Problem List  ? Diagnosis Date Noted  ? Cancer  ([place]) 09/15/2018  ? Dyslipidemia 12/02/2017  ? Primary osteoarthritis of right knee 12/02/2017  ? Essential hypertension, benign 11/13/2017  ? Chronic pain of right knee 11/13/2017  ? CAD (coronary artery disease) 10/07/2016  ? Glaucoma 11/13/2015  ? Osteopenia 09/05/2015  ? CKD (chronic kidney disease) stage 3, GFR 30-59 ml/min (HCC) 12/13/2014  ? History of noncompliance with medical treatment, presenting hazards to health 09/15/2014  ? Pain medication agreement 12/15/2013  ? Allergic rhinitis 11/22/2011  ? Hypertension goal BP (blood pressure) < 140/80 11/22/2011  ? Hx of colonic polyp 11/22/2011  ? Palpitation 11/22/2011  ? ? ?Past Medical History:  ?Diagnosis Date  ? Cancer [facility])   ? HAD BREST CANCER 1977  ? Chronic pain of right knee   ? H/O myocardial infarction, greater than 8 weeks 1986  ? reports normal NM Scan  ? HTN (hypertension)   ? Hyperlipidemia   ? ? ?Past Surgical History:  ?Procedure Laterality Date  ? BREAST SURGERY    ? BILATERAL  ? ? ?Social History  ? ?Socioeconomic History  ? Marital status: Widowed  ?  Spouse name: Not on file  ? Number of children: Not on file  ? Years of education: Not on file  ? Highest education level: Not on file  ?Occupational History  ? Not on file  ?Tobacco Use  ? Smoking status: Never  ? Smokeless tobacco: Never  ?Vaping Use  ? Vaping Use:  Never used  ?Substance and Sexual Activity  ? Alcohol use: Yes  ?  Comment: OCCASIONALLY  ? Drug use: Not Currently  ? Sexual activity: Not Currently  ?Other Topics Concern  ? Not on file  ?Social History Narrative  ? Not on file  ? ?Social Determinants of Health  ? ?Financial Resource Strain: Low Risk   ? Difficulty of Paying Living Expenses: Not hard at all  ?Food Insecurity: No Food Insecurity  ? Worried About Charity fundraiser in the Last Year: Never true  ? Ran Out of Food in the Last Year: Never true  ?Transportation Needs: No Transportation Needs  ? Lack of Transportation (Medical): No  ? Lack of Transportation  (Non-Medical): No  ?Physical Activity: Sufficiently Active  ? Days of Exercise per Week: 7 days  ? Minutes of Exercise per Session: 30 min  ?Stress: No Stress Concern Present  ? Feeling of Stress : Not at all  ?Social Connections: Moderately Integrated  ? Frequency of Communication with Friends and Family: More than three times a week  ? Frequency of Social Gatherings with Friends and Family: More than three times a week  ? Attends Religious Services: More than 4 times per year  ? Active Member of Clubs or Organizations: Yes  ? Attends Archivist Meetings: More than 4 times per year  ? Marital Status: Widowed  ?Intimate Partner Violence: Not At Risk  ? Fear of Current or Ex-Partner: No  ? Emotionally Abused: No  ? Physically Abused: No  ? Sexually Abused: No  ? ? ?Family History  ?Problem Relation Age of Onset  ? Healthy Daughter   ? Lymphoma Son   ? ? ? ?Review of Systems  ?Constitutional: Negative.  Negative for chills and fever.  ?HENT:  Positive for congestion and sinus pain. Negative for sore throat.   ?Respiratory: Negative.  Negative for cough and shortness of breath.   ?Cardiovascular: Negative.  Negative for chest pain and palpitations.  ?Gastrointestinal: Negative.  Negative for abdominal pain, diarrhea, nausea and vomiting.  ?Genitourinary: Negative.  Negative for dysuria and hematuria.  ?Skin: Negative.  Negative for rash.  ?Neurological:  Negative for dizziness and headaches.  ?All other systems reviewed and are negative. ?Today's Vitals  ? 10/02/21 1038  ?BP: 120/72  ?Pulse: 75  ?Temp: 98.2 ?F (36.8 ?C)  ?TempSrc: Oral  ?SpO2: 95%  ?Height: 5' (1.524 m)  ? ?Body mass index is 26.95 kg/m?. ? ? ?Physical Exam ?Vitals reviewed.  ?Constitutional:   ?   Appearance: Normal appearance.  ?HENT:  ?   Head: Normocephalic.  ?   Right Ear: Tympanic membrane, ear canal and external ear normal.  ?   Left Ear: Tympanic membrane, ear canal and external ear normal.  ?   Nose: Congestion present.  ?   Right  Sinus: Maxillary sinus tenderness present.  ?   Left Sinus: Maxillary sinus tenderness present.  ?   Mouth/Throat:  ?   Mouth: Mucous membranes are moist.  ?   Pharynx: Oropharynx is clear.  ?Eyes:  ?   Extraocular Movements: Extraocular movements intact.  ?   Conjunctiva/sclera: Conjunctivae normal.  ?   Pupils: Pupils are equal, round, and reactive to light.  ?Cardiovascular:  ?   Rate and Rhythm: Normal rate and regular rhythm.  ?   Pulses: Normal pulses.  ?   Heart sounds: Normal heart sounds.  ?Pulmonary:  ?   Effort: Pulmonary effort is normal.  ?   Breath  sounds: Normal breath sounds.  ?Abdominal:  ?   General: There is no distension.  ?   Palpations: Abdomen is soft.  ?   Tenderness: There is no abdominal tenderness.  ?Musculoskeletal:     ?   General: Normal range of motion.  ?   Cervical back: No tenderness.  ?   Right lower leg: No edema.  ?   Left lower leg: No edema.  ?Lymphadenopathy:  ?   Cervical: No cervical adenopathy.  ?Skin: ?   General: Skin is warm and dry.  ?   Capillary Refill: Capillary refill takes less than 2 seconds.  ?Neurological:  ?   General: No focal deficit present.  ?   Mental Status: She is alert and oriented to person, place, and time.  ?Psychiatric:     ?   Mood and Affect: Mood normal.     ?   Behavior: Behavior normal.  ? ? ? ?ASSESSMENT & PLAN: ?Problem List Items Addressed This Visit   ? ?  ? Cardiovascular and Mediastinum  ? Essential hypertension, benign  ?  Well-controlled hypertension.  Continue lisinopril 40 mg and hydrochlorothiazide 25 mg daily. ?BP Readings from Last 3 Encounters:  ?10/02/21 120/72  ?04/03/21 120/70  ?03/29/21 124/60  ? ? ?  ?  ? Relevant Orders  ? Comprehensive metabolic panel  ?  ? Respiratory  ? Acute non-recurrent maxillary sinusitis - Primary  ?  Secondary bacterial infection.  We will start Augmentin twice a day for 7 days. ?  ?  ? Relevant Medications  ? amoxicillin-clavulanate (AUGMENTIN) 875-125 MG tablet  ?  ? Genitourinary  ? Stage 3b  chronic kidney disease ([place])  ?  Stable.  Advised to stay well-hydrated and avoid NSAIDs. ?  ?  ?  ? Other  ? Chronic pain of right knee  ?  Advised to take Tylenol for mild-to-moderate pain and tramadol for moderat

## 2021-10-02 NOTE — Assessment & Plan Note (Signed)
Stable.  Advised to stay well-hydrated and avoid NSAIDs. 

## 2021-10-02 NOTE — Assessment & Plan Note (Signed)
Advised to take Tylenol for mild-to-moderate pain and tramadol for moderate to severe pain only as needed.  Advised to avoid NSAIDs due to chronic kidney disease. ?

## 2021-10-02 NOTE — Assessment & Plan Note (Signed)
Secondary bacterial infection.  We will start Augmentin twice a day for 7 days. ?

## 2021-10-04 NOTE — Progress Notes (Signed)
Pt call back gave MD concerning labs.Will received call concerning referral once been made.Marland KitchenJohny Ball ?

## 2022-04-08 ENCOUNTER — Ambulatory Visit (INDEPENDENT_AMBULATORY_CARE_PROVIDER_SITE_OTHER): Payer: Medicare HMO

## 2022-04-08 VITALS — BP 124/76 | HR 75 | Temp 97.7°F | Ht 60.0 in | Wt 136.2 lb

## 2022-04-08 DIAGNOSIS — Z0000 Encounter for general adult medical examination without abnormal findings: Secondary | ICD-10-CM

## 2022-04-08 NOTE — Progress Notes (Signed)
Subjective:   Gina Ball is a 76 y.o. female who presents for Medicare Annual (Subsequent) preventive examination.  Review of Systems     Cardiac Risk Factors include: advanced age (>48mn, >>24women);dyslipidemia;hypertension     Objective:    Today's Vitals   04/08/22 1544  Height: 5' (1.524 m)  PainSc: 0-No pain   Body mass index is 26.95 kg/m.     03/29/2021    2:52 PM 03/22/2020   11:05 AM 03/22/2019   11:09 AM 08/17/2018    1:31 PM  Advanced Directives  Does Patient Have a Medical Advance Directive? Yes Yes No No  Type of Advance Directive Living will;Healthcare Power of [name]   Does patient want to make changes to medical advance directive? No - Patient declined     [place]? No - copy requested No - copy requested    Would patient like information on creating a medical advance directive?   No - Patient declined Yes (ED - Information included in AVS)    Current Medications (verified) Outpatient Encounter Medications as of 04/08/2022  Medication Sig   Ascorbic Acid (VITAMIN C) 1000 MG tablet Take 1,000 mg by mouth daily.   aspirin EC 81 MG tablet Take 81 mg by mouth daily.   Cyanocobalamin (VITAMIN B 12 PO) Take 100 mg by mouth daily.   hydrochlorothiazide (HYDRODIURIL) 25 MG tablet TAKE 1 TABLET EVERY DAY   lisinopril (ZESTRIL) 40 MG tablet TAKE 1 TABLET EVERY DAY   montelukast (SINGULAIR) 10 MG tablet TAKE 1 TABLET AT BEDTIME   Multiple Vitamin (MULTIVITAMIN) tablet Take 1 tablet by mouth daily.   rosuvastatin (CRESTOR) 10 MG tablet TAKE 1 TABLET EVERY DAY   traMADol (ULTRAM) 50 MG tablet Take 1 tablet (50 mg total) by mouth every 12 (twelve) hours as needed.   Vitamin D, Cholecalciferol, 25 MCG (1000 UT) TABS Take by mouth daily.   Zinc 50 MG CAPS Take by mouth daily.   No facility-administered encounter medications on file as of 04/08/2022.    Allergies (verified) Patient has no known  allergies.   History: Past Medical History:  Diagnosis Date   Cancer ([place]    HAD BREST CANCER 1977   Chronic pain of right knee    H/O myocardial infarction, greater than 8 weeks 1986   reports normal NM Scan   HTN (hypertension)    Hyperlipidemia    Past Surgical History:  Procedure Laterality Date   BREAST SURGERY     BILATERAL   Family History  Problem Relation Age of Onset   Healthy Daughter    Lymphoma Son    Social History   Socioeconomic History   Marital status: Widowed    Spouse name: Not on file   Number of children: Not on file   Years of education: Not on file   Highest education level: Not on file  Occupational History   Not on file  Tobacco Use   Smoking status: Never   Smokeless tobacco: Never  Vaping Use   Vaping Use: Never used  Substance and Sexual Activity   Alcohol use: Yes    Comment: OCCASIONALLY   Drug use: Not Currently   Sexual activity: Not Currently  Other Topics Concern   Not on file  Social History Narrative   Not on file   Social Determinants of Health   Financial Resource Strain: Low Risk  (04/08/2022)   Overall Financial Resource Strain (  CARDIA)    Difficulty of Paying Living Expenses: Not hard at all  Food Insecurity: No Food Insecurity (04/08/2022)   Hunger Vital Sign    Worried About Running Out of Food in the Last Year: Never true    Ran Out of Food in the Last Year: Never true  Transportation Needs: No Transportation Needs (04/08/2022)   PRAPARE - Hydrologist (Medical): No    Lack of Transportation (Non-Medical): No  Physical Activity: Sufficiently Active (04/08/2022)   Exercise Vital Sign    Days of Exercise per Week: 5 days    Minutes of Exercise per Session: 30 min  Stress: No Stress Concern Present (04/08/2022)   [place]    Feeling of Stress : Not at all  Social Connections: Moderately Integrated (04/08/2022)    Social Connection and Isolation Panel [NHANES]    Frequency of Communication with Friends and Family: More than three times a week    Frequency of Social Gatherings with Friends and Family: More than three times a week    Attends Religious Services: More than 4 times per year    Active Member of [employer] or Organizations: Yes    Attends Archivist Meetings: More than 4 times per year    Marital Status: Widowed    Tobacco Counseling Counseling given: Not Answered   Clinical Intake:  Pre-visit preparation completed: Yes  Pain : No/denies pain Pain Score: 0-No pain     Nutritional Risks: None Diabetes: No  How often do you need to have someone help you when you read instructions, pamphlets, or other written materials from your doctor or pharmacy?: 1 - Never What is the last grade level you completed in school?: HSG  Diabetic? no  Interpreter Needed?: No  Information entered by :: Gina Abu, LPN.   Activities of Daily Living    04/08/2022    3:46 PM  In your present state of health, do you have any difficulty performing the following activities:  Hearing? 0  Vision? 0  Difficulty concentrating or making decisions? 0  Walking or climbing stairs? 0  Dressing or bathing? 0  Doing errands, shopping? 0  Preparing Food and eating ? N  Using the Toilet? N  In the past six months, have you accidently leaked urine? N  Do you have problems with loss of bowel control? N  Managing your Medications? N  Managing your Finances? N  Housekeeping or managing your Housekeeping? N    Patient Care Team: Gina Pollen, MD as PCP - General (Internal Medicine)  Indicate any recent Medical Services you may have received from other than [facility] providers in the past year (date may be approximate).     Assessment:   This is a routine wellness examination for Gina Ball.  Hearing/Vision screen No results found.  Dietary issues and exercise activities  discussed: Current Exercise Habits: Home exercise routine, Type of exercise: walking, Time (Minutes): 30, Frequency (Times/Week): 5, Weekly Exercise (Minutes/Week): 150, Intensity: Moderate, Exercise limited by: None identified   Goals Addressed   None   Depression Screen    04/08/2022    3:45 PM 10/02/2021   10:40 AM 04/03/2021   10:24 AM 03/29/2021    2:51 PM 09/26/2020   11:16 AM 06/21/2020    3:15 PM 03/22/2020   11:10 AM  PHQ 2/9 Scores  PHQ - 2 Score 0 0 0 0 0 0 0    Fall Risk  04/08/2022    3:46 PM 10/02/2021   10:39 AM 04/03/2021   10:24 AM 03/29/2021    2:52 PM 09/26/2020   11:16 AM  Fall Risk   Falls in the past year? 0 0 0 0 0  Number falls in past yr: 0  0 0 0  Injury with Fall? 0  0 0   Risk for fall due to : No Fall Risks   No Fall Risks No Fall Risks  Follow up Falls prevention discussed   Falls evaluation completed Falls evaluation completed    [place]:  Any stairs in or around the home? Yes  If so, are there any without handrails? No  Home free of loose throw rugs in walkways, pet beds, electrical cords, etc? Yes  Adequate lighting in your home to reduce risk of falls? Yes   ASSISTIVE DEVICES UTILIZED TO PREVENT FALLS:  Life alert? Yes  (Apple Watch) Use of a cane, walker or w/c? No  Grab bars in the bathroom? No  Shower chair or bench in shower? No  Elevated toilet seat or a handicapped toilet? Yes   TIMED UP AND GO:  Was the test performed? Yes .  Length of time to ambulate 10 feet: 6 sec.   Gait steady and fast without use of assistive device  Cognitive Function:        04/08/2022    3:46 PM 03/22/2020   11:06 AM 03/22/2019   11:10 AM  6CIT Screen  What Year? 0 points 0 points 0 points  What month? 0 points 0 points 0 points  What time? 0 points 0 points 0 points  Count back from 20 0 points 0 points 0 points  Months in reverse 0 points 0 points 0 points  Repeat phrase 0 points 0 points 0 points   Total Score 0 points 0 points 0 points    Immunizations Immunization History  Administered Date(s) Administered   Influenza, Seasonal, Injecte, Preservative Fre 04/03/2015   Influenza,inj,quad, With Preservative 03/05/2012   Influenza-Unspecified 05/08/2005, 05/08/2006, 06/13/2008, 04/24/2009, 04/13/2010   PFIZER(Purple Top)SARS-COV-2 Vaccination 09/09/2019, 10/10/2019, 05/05/2020   Pneumococcal Conjugate-13 04/03/2015   Pneumococcal Polysaccharide-23 04/13/2010   Tdap 04/18/2010    TDAP status: Due, Education has been provided regarding the importance of this vaccine. Advised may receive this vaccine at local pharmacy or Health Dept. Aware to provide a copy of the vaccination record if obtained from local pharmacy or Health Dept. Verbalized acceptance and understanding.  Flu Vaccine status: Declined, Education has been provided regarding the importance of this vaccine but patient still declined. Advised may receive this vaccine at local pharmacy or Health Dept. Aware to provide a copy of the vaccination record if obtained from local pharmacy or Health Dept. Verbalized acceptance and understanding.  Pneumococcal vaccine status: Up to date  Covid-19 vaccine status: Completed vaccines  Qualifies for Shingles Vaccine? Yes   Zostavax completed No   Shingrix Completed?: No.    Education has been provided regarding the importance of this vaccine. Patient has been advised to call insurance company to determine out of pocket expense if they have not yet received this vaccine. Advised may also receive vaccine at local pharmacy or Health Dept. Verbalized acceptance and understanding.  Screening Tests Health Maintenance  Topic Date Due   Hepatitis C Screening  Never done   Zoster Vaccines- Shingrix (1 of 2) Never done   TETANUS/TDAP  04/18/2020   COVID-19 Vaccine (4 - Pfizer risk series) 06/30/2020  INFLUENZA VACCINE  01/22/2022   Pneumonia Vaccine 59+ Years old  Completed   DEXA SCAN   Completed   HPV VACCINES  Aged Out    Health Maintenance  Health Maintenance Due  Topic Date Due   Hepatitis C Screening  Never done   Zoster Vaccines- Shingrix (1 of 2) Never done   TETANUS/TDAP  04/18/2020   COVID-19 Vaccine (4 - Pfizer risk series) 06/30/2020   INFLUENZA VACCINE  01/22/2022    Colorectal cancer screening: No longer required.   Mammogram status: Completed 04/28/2017. Repeat every year  Bone Density status: Completed 09/10/2015. Results reflect: Bone density results: OSTEOPENIA. Repeat every 2-3 years.  Lung Cancer Screening: (Low Dose CT Chest recommended if Age 69-80 years, 30 pack-year currently smoking OR have quit w/in 15years.) does not qualify.   Lung Cancer Screening Referral: no  Additional Screening:  Hepatitis C Screening: does qualify; Completed no  Vision Screening: Recommended annual ophthalmology exams for early detection of glaucoma and other disorders of the eye. Is the patient up to date with their annual eye exam?  Yes  Who is the provider or what is the name of the office in which the patient attends annual eye exams? [facility] If pt is not established with a provider, would they like to be referred to a provider to establish care? No .   Dental Screening: Recommended annual dental exams for proper oral hygiene  Community Resource Referral / Chronic Care Management: CRR required this visit?  No   CCM required this visit?  No      Plan:     I have personally reviewed and noted the following in the patient's chart:   Medical and social history Use of alcohol, tobacco or illicit drugs  Current medications and supplements including opioid prescriptions. Patient is not currently taking opioid prescriptions. Functional ability and status Nutritional status Physical activity Advanced directives List of other physicians Hospitalizations, surgeries, and ER visits in previous 12 months Vitals Screenings to include cognitive,  depression, and falls Referrals and appointments  In addition, I have reviewed and discussed with patient certain preventive protocols, quality metrics, and best practice recommendations. A written personalized care plan for preventive services as well as general preventive health recommendations were provided to patient.     Sheral Flow, LPN   88/28/[id number]   Nurse Notes: N/A

## 2022-04-08 NOTE — Patient Instructions (Addendum)
Ms. Gina Ball , Thank you for taking time to come for your Medicare Wellness Visit. I appreciate your ongoing commitment to your health goals. Please review the following plan we discussed and let me know if I can assist you in the future.   These are the goals we discussed:  Goals      To maintain my health, stay independent, physically and socially active.        This is a list of the screening recommended for you and due dates:  Health Maintenance  Topic Date Due   Hepatitis C Screening: USPSTF Recommendation to screen - Ages 61-77 yo.  Never done   Zoster (Shingles) Vaccine (1 of 2) Never done   Tetanus Vaccine  04/18/2020   COVID-19 Vaccine (4 - Pfizer risk series) 06/30/2020   Flu Shot  01/22/2022   Pneumonia Vaccine  Completed   DEXA scan (bone density measurement)  Completed   HPV Vaccine  Aged Out    Advanced directives: No; daughter is aware of wishes.  Conditions/risks identified: Yes; Stay active, independent and social.  Next appointment: Follow up in one year for your annual wellness visit.   Preventive Care 77 Years and Older, Female Preventive care refers to lifestyle choices and visits with your health care provider that can promote health and wellness. What does preventive care include? A yearly physical exam. This is also called an annual well check. Dental exams once or twice a year. Routine eye exams. Ask your health care provider how often you should have your eyes checked. Personal lifestyle choices, including: Daily care of your teeth and gums. Regular physical activity. Eating a healthy diet. Avoiding tobacco and drug use. Limiting alcohol use. Practicing safe sex. Taking low-dose aspirin every day. Taking vitamin and mineral supplements as recommended by your health care provider. What happens during an annual well check? The services and screenings done by your health care provider during your annual well check will depend on your age, overall  health, lifestyle risk factors, and family history of disease. Counseling  Your health care provider may ask you questions about your: Alcohol use. Tobacco use. Drug use. Emotional well-being. Home and relationship well-being. Sexual activity. Eating habits. History of falls. Memory and ability to understand (cognition). Work and work Statistician. Reproductive health. Screening  You may have the following tests or measurements: Height, weight, and BMI. Blood pressure. Lipid and cholesterol levels. These may be checked every 5 years, or more frequently if you are over 19 years old. Skin check. Lung cancer screening. You may have this screening every year starting at age 77 if you have a 30-pack-year history of smoking and currently smoke or have quit within the past 15 years. Fecal occult blood test (FOBT) of the stool. You may have this test every year starting at age 77 Flexible sigmoidoscopy or colonoscopy. You may have a sigmoidoscopy every 5 years or a colonoscopy every 10 years starting at age 77 Hepatitis C blood test. Hepatitis B blood test. Sexually transmitted disease (STD) testing. Diabetes screening. This is done by checking your blood sugar (glucose) after you have not eaten for a while (fasting). You may have this done every 1-3 years. Bone density scan. This is done to screen for osteoporosis. You may have this done starting at age 77 Mammogram. This may be done every 1-2 years. Talk to your health care provider about how often you should have regular mammograms. Talk with your health care provider about your test results, treatment options,  and if necessary, the need for more tests. Vaccines  Your health care provider may recommend certain vaccines, such as: Influenza vaccine. This is recommended every year. Tetanus, diphtheria, and acellular pertussis (Tdap, Td) vaccine. You may need a Td booster every 10 years. Zoster vaccine. You may need this after age  77 Pneumococcal 13-valent conjugate (PCV13) vaccine. One dose is recommended after age 77 Pneumococcal polysaccharide (PPSV23) vaccine. One dose is recommended after age 77 Talk to your health care provider about which screenings and vaccines you need and how often you need them. This information is not intended to replace advice given to you by your health care provider. Make sure you discuss any questions you have with your health care provider. Document Released: 07/07/2015 Document Revised: 02/28/2016 Document Reviewed: 04/11/2015 Elsevier Interactive Patient Education  2017 [place] Prevention in the Home Falls can cause injuries. They can happen to people of all ages. There are many things you can do to make your home safe and to help prevent falls. What can I do on the outside of my home? Regularly fix the edges of walkways and driveways and fix any cracks. Remove anything that might make you trip as you walk through a door, such as a raised step or threshold. Trim any bushes or trees on the path to your home. Use bright outdoor lighting. Clear any walking paths of anything that might make someone trip, such as rocks or tools. Regularly check to see if handrails are loose or broken. Make sure that both sides of any steps have handrails. Any raised decks and porches should have guardrails on the edges. Have any leaves, snow, or ice cleared regularly. Use sand or salt on walking paths during winter. Clean up any spills in your garage right away. This includes oil or grease spills. What can I do in the bathroom? Use night lights. Install grab bars by the toilet and in the tub and shower. Do not use towel bars as grab bars. Use non-skid mats or decals in the tub or shower. If you need to sit down in the shower, use a plastic, non-slip stool. Keep the floor dry. Clean up any water that spills on the floor as soon as it happens. Remove soap buildup in the tub or shower  regularly. Attach bath mats securely with double-sided non-slip rug tape. Do not have throw rugs and other things on the floor that can make you trip. What can I do in the bedroom? Use night lights. Make sure that you have a light by your bed that is easy to reach. Do not use any sheets or blankets that are too big for your bed. They should not hang down onto the floor. Have a firm chair that has side arms. You can use this for support while you get dressed. Do not have throw rugs and other things on the floor that can make you trip. What can I do in the kitchen? Clean up any spills right away. Avoid walking on wet floors. Keep items that you use a lot in easy-to-reach places. If you need to reach something above you, use a strong step stool that has a grab bar. Keep electrical cords out of the way. Do not use floor polish or wax that makes floors slippery. If you must use wax, use non-skid floor wax. Do not have throw rugs and other things on the floor that can make you trip. What can I do with my stairs? Do not leave  any items on the stairs. Make sure that there are handrails on both sides of the stairs and use them. Fix handrails that are broken or loose. Make sure that handrails are as long as the stairways. Check any carpeting to make sure that it is firmly attached to the stairs. Fix any carpet that is loose or worn. Avoid having throw rugs at the top or bottom of the stairs. If you do have throw rugs, attach them to the floor with carpet tape. Make sure that you have a light switch at the top of the stairs and the bottom of the stairs. If you do not have them, ask someone to add them for you. What else can I do to help prevent falls? Wear shoes that: Do not have high heels. Have rubber bottoms. Are comfortable and fit you well. Are closed at the toe. Do not wear sandals. If you use a stepladder: Make sure that it is fully opened. Do not climb a closed stepladder. Make sure that  both sides of the stepladder are locked into place. Ask someone to hold it for you, if possible. Clearly mark and make sure that you can see: Any grab bars or handrails. First and last steps. Where the edge of each step is. Use tools that help you move around (mobility aids) if they are needed. These include: Canes. Walkers. Scooters. Crutches. Turn on the lights when you go into a dark area. Replace any light bulbs as soon as they burn out. Set up your furniture so you have a clear path. Avoid moving your furniture around. If any of your floors are uneven, fix them. If there are any pets around you, be aware of where they are. Review your medicines with your doctor. Some medicines can make you feel dizzy. This can increase your chance of falling. Ask your doctor what other things that you can do to help prevent falls. This information is not intended to replace advice given to you by your health care provider. Make sure you discuss any questions you have with your health care provider. Document Released: 04/06/2009 Document Revised: 11/16/2015 Document Reviewed: 07/15/2014 Elsevier Interactive Patient Education  2017 [employer].

## 2022-05-20 ENCOUNTER — Ambulatory Visit: Payer: Medicare HMO | Admitting: Emergency Medicine

## 2022-05-27 ENCOUNTER — Encounter: Payer: Self-pay | Admitting: Emergency Medicine

## 2022-05-27 ENCOUNTER — Ambulatory Visit (INDEPENDENT_AMBULATORY_CARE_PROVIDER_SITE_OTHER): Payer: Medicare HMO | Admitting: Emergency Medicine

## 2022-05-27 VITALS — BP 124/80 | HR 76 | Temp 98.8°F | Ht 60.0 in | Wt 139.2 lb

## 2022-05-27 DIAGNOSIS — G8929 Other chronic pain: Secondary | ICD-10-CM

## 2022-05-27 DIAGNOSIS — E785 Hyperlipidemia, unspecified: Secondary | ICD-10-CM | POA: Diagnosis not present

## 2022-05-27 DIAGNOSIS — M25561 Pain in right knee: Secondary | ICD-10-CM

## 2022-05-27 DIAGNOSIS — N1832 Chronic kidney disease, stage 3b: Secondary | ICD-10-CM | POA: Diagnosis not present

## 2022-05-27 DIAGNOSIS — I10 Essential (primary) hypertension: Secondary | ICD-10-CM

## 2022-05-27 MED ORDER — TRAMADOL HCL 50 MG PO TABS: 50.0000 mg | ORAL_TABLET | Freq: Two times a day (BID) | ORAL | 2 refills | Status: DC | PRN

## 2022-05-27 NOTE — Assessment & Plan Note (Signed)
Stable.  Diet and nutrition discussed.  Continue rosuvastatin 10 mg daily. The 10-year ASCVD risk score (Arnett DK, et al., 2019) is: 24.6%   Values used to calculate the score:     Age: 76 years     Sex: Female     Is Non-Hispanic African American: No     Diabetic: No     Tobacco smoker: No     Systolic Blood Pressure: 257 mmHg     Is BP treated: Yes     HDL Cholesterol: 66.9 mg/dL     Total Cholesterol: 155 mg/dL

## 2022-05-27 NOTE — Assessment & Plan Note (Signed)
Continues to take tramadol 50 mg as needed for pain.

## 2022-05-27 NOTE — Progress Notes (Signed)
Gina Ball 76 y.o.   Chief Complaint  Patient presents with   Follow-up    F/U for meds.     HISTORY OF PRESENT ILLNESS: This is a 76 y.o. female here for 62-monthfollow-up of hypertension Also has chronic pain secondary to osteoarthritis.  Requesting refill on her tramadol. No other complaints or medical concerns today.  HPI   Prior to Admission medications   Medication Sig Start Date End Date Taking? Authorizing Provider  Ascorbic Acid (VITAMIN C) 1000 MG tablet Take 1,000 mg by mouth daily.   Yes [provider]  aspirin EC 81 MG tablet Take 81 mg by mouth daily.   Yes [provider]  Cyanocobalamin (VITAMIN B 12 PO) Take 100 mg by mouth daily.   Yes [provider]  hydrochlorothiazide (HYDRODIURIL) 25 MG tablet TAKE 1 TABLET EVERY DAY 06/30/21  Yes SHorald Pollen MD  lisinopril (ZESTRIL) 40 MG tablet TAKE 1 TABLET EVERY DAY 06/30/21  Yes Lindell Renfrew, MInes Bloomer MD  montelukast (SINGULAIR) 10 MG tablet TAKE 1 TABLET AT BEDTIME 08/22/21  Yes Kevron Patella, MInes Bloomer MD  Multiple Vitamin (MULTIVITAMIN) tablet Take 1 tablet by mouth daily.   Yes [provider]  rosuvastatin (CRESTOR) 10 MG tablet TAKE 1 TABLET EVERY DAY 09/06/21  Yes Maralyn Witherell, MInes Bloomer MD  Vitamin D, Cholecalciferol, 25 MCG (1000 UT) TABS Take by mouth daily.   Yes [provider]  Zinc 50 MG CAPS Take by mouth daily.   Yes [provider]  traMADol (ULTRAM) 50 MG tablet Take 1 tablet (50 mg total) by mouth every 12 (twelve) hours as needed. 05/27/22   SHorald Pollen MD    No Known Allergies  Patient Active Problem List   Diagnosis Date Noted   Cancer ([facility] 09/15/2018   Dyslipidemia 12/02/2017   Primary osteoarthritis of right knee 12/02/2017   Essential hypertension, benign 11/13/2017   Chronic pain of right knee 11/13/2017   CAD (coronary artery disease) 10/07/2016   Glaucoma 11/13/2015   Osteopenia 09/05/2015   Stage 3b [place]  [place] 12/13/2014   History of noncompliance with medical treatment, presenting hazards to health 09/15/2014   Pain medication agreement 12/15/2013   Allergic rhinitis 11/22/2011   Hypertension goal BP (blood pressure) < 140/80 11/22/2011   Hx of colonic polyp 11/22/2011   Palpitation 11/22/2011    Past Medical History:  Diagnosis Date   [place] ([facility]    HAD BREST CANCER 1977   Chronic pain of right knee    H/O myocardial infarction, greater than 8 weeks 1986   reports normal NM Scan   HTN (hypertension)    Hyperlipidemia     Past Surgical History:  Procedure Laterality Date   BREAST SURGERY     BILATERAL    Social History   Socioeconomic History   Marital status: Widowed    Spouse name: Not on file   Number of children: Not on file   Years of education: Not on file   Highest education level: Not on file  Occupational History   Not on file  Tobacco Use   Smoking status: Never   Smokeless tobacco: Never  Vaping Use   Vaping Use: Never used  Substance and Sexual Activity   Alcohol use: Yes    Comment: OCCASIONALLY   Drug use: Not Currently   Sexual activity: Not Currently  Other Topics Concern   Not on file  Social History Narrative   Not on file   Social Determinants  of Health   Financial Resource Strain: Low Risk  (04/08/2022)   Overall Financial Resource Strain (CARDIA)    Difficulty of Paying Living Expenses: Not hard at all  Food Insecurity: No Food Insecurity (04/08/2022)   Hunger Vital Sign    Worried About Running Out of Food in the Last Year: Never true    Ran Out of Food in the Last Year: Never true  Transportation Needs: No Transportation Needs (04/08/2022)   PRAPARE - Hydrologist (Medical): No    Lack of Transportation (Non-Medical): No  Physical Activity: Sufficiently Active (04/08/2022)   Exercise Vital Sign    Days of Exercise per Week: 5 days    Minutes of Exercise per Session: 30 min  Stress: No  Stress Concern Present (04/08/2022)   [place]    Feeling of Stress : Not at all  Social Connections: Moderately Integrated (04/08/2022)   Social Connection and Isolation Panel [NHANES]    Frequency of Communication with Friends and Family: More than three times a week    Frequency of Social Gatherings with Friends and Family: More than three times a week    Attends Religious Services: More than 4 times per year    Active Member of [employer] or Organizations: Yes    Attends Archivist Meetings: More than 4 times per year    Marital Status: Widowed  Intimate Partner Violence: Not At Risk (04/08/2022)   Humiliation, Afraid, Rape, and Kick questionnaire    Fear of Current or Ex-Partner: No    Emotionally Abused: No    Physically Abused: No    Sexually Abused: No    Family History  Problem Relation Age of Onset   Healthy Daughter    Lymphoma Son      Review of Systems  Constitutional: Negative.  Negative for chills and fever.  HENT: Negative.  Negative for congestion and sore throat.   Respiratory: Negative.  Negative for cough and shortness of breath.   Cardiovascular: Negative.  Negative for chest pain and palpitations.  Gastrointestinal:  Negative for abdominal pain, diarrhea, nausea and vomiting.  Genitourinary: Negative.  Negative for dysuria and hematuria.  Musculoskeletal:  Positive for joint pain.  Skin: Negative.  Negative for rash.  Neurological: Negative.  Negative for dizziness and headaches.  All other systems reviewed and are negative.  Today's Vitals   05/27/22 1018 05/27/22 1104  BP: (!) 180/90 124/80  Pulse: 76   Temp: 98.8 F (37.1 C)   TempSrc: Oral   SpO2: 98%   Weight: 139 lb 3.2 oz (63.1 kg)   Height: 5' (1.524 m)    Body mass index is 27.19 kg/m.   Physical Exam Vitals reviewed.  Constitutional:      Appearance: Normal appearance.  HENT:     Head: Normocephalic.   Eyes:     Extraocular Movements: Extraocular movements intact.     Conjunctiva/sclera: Conjunctivae normal.     Pupils: Pupils are equal, round, and reactive to light.  Cardiovascular:     Rate and Rhythm: Normal rate and regular rhythm.     Pulses: Normal pulses.     Heart sounds: Normal heart sounds.  Pulmonary:     Effort: Pulmonary effort is normal.     Breath sounds: Normal breath sounds.  Musculoskeletal:     Cervical back: No tenderness.  Lymphadenopathy:     Cervical: No cervical adenopathy.  Skin:    General: Skin  is warm and dry.  Neurological:     General: No focal deficit present.     Mental Status: She is alert and oriented to person, place, and time.  Psychiatric:        Mood and Affect: Mood normal.        Behavior: Behavior normal.      ASSESSMENT & PLAN: A total of 46 minutes was spent with the patient and counseling/coordination of care regarding preparing for this visit, review of most recent office visit notes, diagnosis of uncontrolled hypertension and cardiovascular risks associated with this condition, review of multiple chronic medical problems and their management including chronic kidney disease and need for nephrology evaluation, education on nutrition, review of all medications, prognosis, documentation, need for follow-up.  Problem List Items Addressed This Visit       Cardiovascular and Mediastinum   Essential hypertension, benign - Primary    Elevated blood pressure reading at home. Normal blood pressure readings at home. Patient does not want to change medication. Advised to monitor blood pressure readings at home daily for the next several weeks and keep a log.  Advised to contact the office if numbers persistently abnormal. In the meantime continue lisinopril 40 mg and hydrochlorothiazide 25 mg daily. Cardiovascular risks associated with uncontrolled hypertension discussed.      Relevant Orders   Comprehensive metabolic panel      Genitourinary   Stage 3b chronic kidney disease ([place])    Advised to stay well-hydrated and avoid NSAIDs. Needs nephrology evaluation. Referral placed today.      Relevant Orders   Ambulatory referral to Nephrology   Comprehensive metabolic panel     Other   Chronic pain of right knee    Continues to take tramadol 50 mg as needed for pain.      Relevant Medications   traMADol (ULTRAM) 50 MG tablet   Dyslipidemia    Stable.  Diet and nutrition discussed.  Continue rosuvastatin 10 mg daily. The 10-year ASCVD risk score (Arnett DK, et al., 2019) is: 24.6%   Values used to calculate the score:     Age: 67 years     Sex: Female     Is Non-Hispanic African American: No     Diabetic: No     Tobacco smoker: No     Systolic Blood Pressure: 381 mmHg     Is BP treated: Yes     HDL Cholesterol: 66.9 mg/dL     Total Cholesterol: 155 mg/dL       Relevant Orders   Lipid panel   Patient Instructions  Hypertension, Adult High blood pressure (hypertension) is when the force of blood pumping through the arteries is too strong. The arteries are the blood vessels that carry blood from the heart throughout the body. Hypertension forces the heart to work harder to pump blood and may cause arteries to become narrow or stiff. Untreated or uncontrolled hypertension can lead to a heart attack, heart failure, a stroke, kidney disease, and other problems. A blood pressure reading consists of a higher number over a lower number. Ideally, your blood pressure should be below 120/80. The first ("top") number is called the systolic pressure. It is a measure of the pressure in your arteries as your heart beats. The second ("bottom") number is called the diastolic pressure. It is a measure of the pressure in your arteries as the heart relaxes. What are the causes? The exact cause of this condition is not known. There are some conditions  that result in high blood pressure. What increases the risk? Certain  factors may make you more likely to develop high blood pressure. Some of these risk factors are under your control, including: Smoking. Not getting enough exercise or physical activity. Being overweight. Having too much fat, sugar, calories, or salt (sodium) in your diet. Drinking too much alcohol. Other risk factors include: Having a personal history of heart disease, diabetes, high cholesterol, or kidney disease. Stress. Having a family history of high blood pressure and high cholesterol. Having obstructive sleep apnea. Age. The risk increases with age. What are the signs or symptoms? High blood pressure may not cause symptoms. Very high blood pressure (hypertensive crisis) may cause: Headache. Fast or irregular heartbeats (palpitations). Shortness of breath. Nosebleed. Nausea and vomiting. Vision changes. Severe chest pain, dizziness, and seizures. How is this diagnosed? This condition is diagnosed by measuring your blood pressure while you are seated, with your arm resting on a flat surface, your legs uncrossed, and your feet flat on the floor. The cuff of the blood pressure monitor will be placed directly against the skin of your upper arm at the level of your heart. Blood pressure should be measured at least twice using the same arm. Certain conditions can cause a difference in blood pressure between your right and left arms. If you have a high blood pressure reading during one visit or you have normal blood pressure with other risk factors, you may be asked to: Return on a different day to have your blood pressure checked again. Monitor your blood pressure at home for 1 week or longer. If you are diagnosed with hypertension, you may have other blood or imaging tests to help your health care provider understand your overall risk for other conditions. How is this treated? This condition is treated by making healthy lifestyle changes, such as eating healthy foods, exercising more, and  reducing your alcohol intake. You may be referred for counseling on a healthy diet and physical activity. Your health care provider may prescribe medicine if lifestyle changes are not enough to get your blood pressure under control and if: Your systolic blood pressure is above 130. Your diastolic blood pressure is above 80. Your personal target blood pressure may vary depending on your medical conditions, your age, and other factors. Follow these instructions at home: Eating and drinking  Eat a diet that is high in fiber and potassium, and low in sodium, added sugar, and fat. An example of this eating plan is called the DASH diet. DASH stands for Dietary Approaches to Stop Hypertension. To eat this way: Eat plenty of fresh fruits and vegetables. Try to fill one half of your plate at each meal with fruits and vegetables. Eat whole grains, such as whole-wheat pasta, brown rice, or whole-grain bread. Fill about one fourth of your plate with whole grains. Eat or drink low-fat dairy products, such as skim milk or low-fat yogurt. Avoid fatty cuts of meat, processed or cured meats, and poultry with skin. Fill about one fourth of your plate with lean proteins, such as fish, chicken without skin, beans, eggs, or tofu. Avoid pre-made and processed foods. These tend to be higher in sodium, added sugar, and fat. Reduce your daily sodium intake. Many people with hypertension should eat less than 1,500 mg of sodium a day. Do not drink alcohol if: Your health care provider tells you not to drink. You are pregnant, may be pregnant, or are planning to become pregnant. If you drink alcohol: Limit  how much you have to: 0-1 drink a day for women. 0-2 drinks a day for men. Know how much alcohol is in your drink. In the [place] one drink equals one 12 oz bottle of beer (355 mL), one 5 oz glass of wine (148 mL), or one 1 oz glass of hard liquor (44 mL). Lifestyle  Work with your health care provider to maintain a  healthy body weight or to lose weight. Ask what an ideal weight is for you. Get at least 30 minutes of exercise that causes your heart to beat faster (aerobic exercise) most days of the week. Activities may include walking, swimming, or biking. Include exercise to strengthen your muscles (resistance exercise), such as Pilates or lifting weights, as part of your weekly exercise routine. Try to do these types of exercises for 30 minutes at least 3 days a week. Do not use any products that contain nicotine or tobacco. These products include cigarettes, chewing tobacco, and vaping devices, such as e-cigarettes. If you need help quitting, ask your health care provider. Monitor your blood pressure at home as told by your health care provider. Keep all follow-up visits. This is important. Medicines Take over-the-counter and prescription medicines only as told by your health care provider. Follow directions carefully. Blood pressure medicines must be taken as prescribed. Do not skip doses of blood pressure medicine. Doing this puts you at risk for problems and can make the medicine less effective. Ask your health care provider about side effects or reactions to medicines that you should watch for. Contact a health care provider if you: Think you are having a reaction to a medicine you are taking. Have headaches that keep coming back (recurring). Feel dizzy. Have swelling in your ankles. Have trouble with your vision. Get help right away if you: Develop a severe headache or confusion. Have unusual weakness or numbness. Feel faint. Have severe pain in your chest or abdomen. Vomit repeatedly. Have trouble breathing. These symptoms may be an emergency. Get help right away. Call 911. Do not wait to see if the symptoms will go away. Do not drive yourself to the hospital. Summary Hypertension is when the force of blood pumping through your arteries is too strong. If this condition is not controlled, it  may put you at risk for serious complications. Your personal target blood pressure may vary depending on your medical conditions, your age, and other factors. For most people, a normal blood pressure is less than 120/80. Hypertension is treated with lifestyle changes, medicines, or a combination of both. Lifestyle changes include losing weight, eating a healthy, low-sodium diet, exercising more, and limiting alcohol. This information is not intended to replace advice given to you by your health care provider. Make sure you discuss any questions you have with your health care provider. Document Revised: 04/17/2021 Document Reviewed: 04/17/2021 Elsevier Patient Education  [place], MD [facility]

## 2022-05-27 NOTE — Assessment & Plan Note (Signed)
Elevated blood pressure reading at home. Normal blood pressure readings at home. Patient does not want to change medication. Advised to monitor blood pressure readings at home daily for the next several weeks and keep a log.  Advised to contact the office if numbers persistently abnormal. In the meantime continue lisinopril 40 mg and hydrochlorothiazide 25 mg daily. Cardiovascular risks associated with uncontrolled hypertension discussed.

## 2022-05-27 NOTE — Assessment & Plan Note (Signed)
Advised to stay well-hydrated and avoid NSAIDs. Needs nephrology evaluation. Referral placed today.

## 2022-05-27 NOTE — Patient Instructions (Signed)
Hypertension, Adult High blood pressure (hypertension) is when the force of blood pumping through the arteries is too strong. The arteries are the blood vessels that carry blood from the heart throughout the body. Hypertension forces the heart to work harder to pump blood and may cause arteries to become narrow or stiff. Untreated or uncontrolled hypertension can lead to a heart attack, heart failure, a stroke, kidney disease, and other problems. A blood pressure reading consists of a higher number over a lower number. Ideally, your blood pressure should be below 120/80. The first ("top") number is called the systolic pressure. It is a measure of the pressure in your arteries as your heart beats. The second ("bottom") number is called the diastolic pressure. It is a measure of the pressure in your arteries as the heart relaxes. What are the causes? The exact cause of this condition is not known. There are some conditions that result in high blood pressure. What increases the risk? Certain factors may make you more likely to develop high blood pressure. Some of these risk factors are under your control, including: Smoking. Not getting enough exercise or physical activity. Being overweight. Having too much fat, sugar, calories, or salt (sodium) in your diet. Drinking too much alcohol. Other risk factors include: Having a personal history of heart disease, diabetes, high cholesterol, or kidney disease. Stress. Having a family history of high blood pressure and high cholesterol. Having obstructive sleep apnea. Age. The risk increases with age. What are the signs or symptoms? High blood pressure may not cause symptoms. Very high blood pressure (hypertensive crisis) may cause: Headache. Fast or irregular heartbeats (palpitations). Shortness of breath. Nosebleed. Nausea and vomiting. Vision changes. Severe chest pain, dizziness, and seizures. How is this diagnosed? This condition is diagnosed by  measuring your blood pressure while you are seated, with your arm resting on a flat surface, your legs uncrossed, and your feet flat on the floor. The cuff of the blood pressure monitor will be placed directly against the skin of your upper arm at the level of your heart. Blood pressure should be measured at least twice using the same arm. Certain conditions can cause a difference in blood pressure between your right and left arms. If you have a high blood pressure reading during one visit or you have normal blood pressure with other risk factors, you may be asked to: Return on a different day to have your blood pressure checked again. Monitor your blood pressure at home for 1 week or longer. If you are diagnosed with hypertension, you may have other blood or imaging tests to help your health care provider understand your overall risk for other conditions. How is this treated? This condition is treated by making healthy lifestyle changes, such as eating healthy foods, exercising more, and reducing your alcohol intake. You may be referred for counseling on a healthy diet and physical activity. Your health care provider may prescribe medicine if lifestyle changes are not enough to get your blood pressure under control and if: Your systolic blood pressure is above 130. Your diastolic blood pressure is above 80. Your personal target blood pressure may vary depending on your medical conditions, your age, and other factors. Follow these instructions at home: Eating and drinking  Eat a diet that is high in fiber and potassium, and low in sodium, added sugar, and fat. An example of this eating plan is called the DASH diet. DASH stands for Dietary Approaches to Stop Hypertension. To eat this way: Eat   plenty of fresh fruits and vegetables. Try to fill one half of your plate at each meal with fruits and vegetables. Eat whole grains, such as whole-wheat pasta, brown rice, or whole-grain bread. Fill about one  fourth of your plate with whole grains. Eat or drink low-fat dairy products, such as skim milk or low-fat yogurt. Avoid fatty cuts of meat, processed or cured meats, and poultry with skin. Fill about one fourth of your plate with lean proteins, such as fish, chicken without skin, beans, eggs, or tofu. Avoid pre-made and processed foods. These tend to be higher in sodium, added sugar, and fat. Reduce your daily sodium intake. Many people with hypertension should eat less than 1,500 mg of sodium a day. Do not drink alcohol if: Your health care provider tells you not to drink. You are pregnant, may be pregnant, or are planning to become pregnant. If you drink alcohol: Limit how much you have to: 0-1 drink a day for women. 0-2 drinks a day for men. Know how much alcohol is in your drink. In the [place] one drink equals one 12 oz bottle of beer (355 mL), one 5 oz glass of wine (148 mL), or one 1 oz glass of hard liquor (44 mL). Lifestyle  Work with your health care provider to maintain a healthy body weight or to lose weight. Ask what an ideal weight is for you. Get at least 30 minutes of exercise that causes your heart to beat faster (aerobic exercise) most days of the week. Activities may include walking, swimming, or biking. Include exercise to strengthen your muscles (resistance exercise), such as Pilates or lifting weights, as part of your weekly exercise routine. Try to do these types of exercises for 30 minutes at least 3 days a week. Do not use any products that contain nicotine or tobacco. These products include cigarettes, chewing tobacco, and vaping devices, such as e-cigarettes. If you need help quitting, ask your health care provider. Monitor your blood pressure at home as told by your health care provider. Keep all follow-up visits. This is important. Medicines Take over-the-counter and prescription medicines only as told by your health care provider. Follow directions carefully. Blood  pressure medicines must be taken as prescribed. Do not skip doses of blood pressure medicine. Doing this puts you at risk for problems and can make the medicine less effective. Ask your health care provider about side effects or reactions to medicines that you should watch for. Contact a health care provider if you: Think you are having a reaction to a medicine you are taking. Have headaches that keep coming back (recurring). Feel dizzy. Have swelling in your ankles. Have trouble with your vision. Get help right away if you: Develop a severe headache or confusion. Have unusual weakness or numbness. Feel faint. Have severe pain in your chest or abdomen. Vomit repeatedly. Have trouble breathing. These symptoms may be an emergency. Get help right away. Call 911. Do not wait to see if the symptoms will go away. Do not drive yourself to the hospital. Summary Hypertension is when the force of blood pumping through your arteries is too strong. If this condition is not controlled, it may put you at risk for serious complications. Your personal target blood pressure may vary depending on your medical conditions, your age, and other factors. For most people, a normal blood pressure is less than 120/80. Hypertension is treated with lifestyle changes, medicines, or a combination of both. Lifestyle changes include losing weight, eating a healthy,   low-sodium diet, exercising more, and limiting alcohol. This information is not intended to replace advice given to you by your health care provider. Make sure you discuss any questions you have with your health care provider. Document Revised: 04/17/2021 Document Reviewed: 04/17/2021 Elsevier Patient Education  2023 [employer]  

## 2022-09-25 ENCOUNTER — Other Ambulatory Visit: Payer: Self-pay | Admitting: Emergency Medicine

## 2022-09-25 DIAGNOSIS — I10 Essential (primary) hypertension: Secondary | ICD-10-CM

## 2022-11-11 ENCOUNTER — Other Ambulatory Visit: Payer: Self-pay | Admitting: Emergency Medicine

## 2022-11-11 DIAGNOSIS — E785 Hyperlipidemia, unspecified: Secondary | ICD-10-CM

## 2022-11-11 DIAGNOSIS — G8929 Other chronic pain: Secondary | ICD-10-CM

## 2023-02-11 ENCOUNTER — Encounter: Payer: Self-pay | Admitting: Emergency Medicine

## 2023-02-11 ENCOUNTER — Ambulatory Visit (INDEPENDENT_AMBULATORY_CARE_PROVIDER_SITE_OTHER): Payer: Medicare HMO | Admitting: Emergency Medicine

## 2023-02-11 VITALS — BP 132/78 | HR 76 | Temp 97.9°F | Ht 60.0 in | Wt 136.5 lb

## 2023-02-11 DIAGNOSIS — M25561 Pain in right knee: Secondary | ICD-10-CM

## 2023-02-11 DIAGNOSIS — Z130 Encounter for screening for diseases of the blood and blood-forming organs and certain disorders involving the immune mechanism: Secondary | ICD-10-CM

## 2023-02-11 DIAGNOSIS — I2510 Atherosclerotic heart disease of native coronary artery without angina pectoris: Secondary | ICD-10-CM | POA: Diagnosis not present

## 2023-02-11 DIAGNOSIS — Z0000 Encounter for general adult medical examination without abnormal findings: Secondary | ICD-10-CM

## 2023-02-11 DIAGNOSIS — G8929 Other chronic pain: Secondary | ICD-10-CM

## 2023-02-11 DIAGNOSIS — I10 Essential (primary) hypertension: Secondary | ICD-10-CM

## 2023-02-11 DIAGNOSIS — Z13228 Encounter for screening for other metabolic disorders: Secondary | ICD-10-CM

## 2023-02-11 DIAGNOSIS — E785 Hyperlipidemia, unspecified: Secondary | ICD-10-CM

## 2023-02-11 DIAGNOSIS — C801 Malignant (primary) neoplasm, unspecified: Secondary | ICD-10-CM | POA: Diagnosis not present

## 2023-02-11 DIAGNOSIS — Z13220 Encounter for screening for lipoid disorders: Secondary | ICD-10-CM | POA: Diagnosis not present

## 2023-02-11 DIAGNOSIS — Z1329 Encounter for screening for other suspected endocrine disorder: Secondary | ICD-10-CM

## 2023-02-11 DIAGNOSIS — M1711 Unilateral primary osteoarthritis, right knee: Secondary | ICD-10-CM | POA: Diagnosis not present

## 2023-02-11 DIAGNOSIS — N1832 Chronic kidney disease, stage 3b: Secondary | ICD-10-CM

## 2023-02-11 MED ORDER — TRAMADOL HCL 50 MG PO TABS
50.0000 mg | ORAL_TABLET | Freq: Two times a day (BID) | ORAL | 1 refills | Status: DC | PRN
Start: 2023-02-11 — End: 2023-06-02

## 2023-02-11 NOTE — Assessment & Plan Note (Signed)
No recent anginal episodes Continues daily baby aspirin

## 2023-02-11 NOTE — Assessment & Plan Note (Signed)
With chronic right knee pain Takes tramadol as needed

## 2023-02-11 NOTE — Assessment & Plan Note (Addendum)
BP Readings from Last 3 Encounters:  02/11/23 132/78  05/27/22 124/80  04/08/22 124/76  Well-controlled hypertension Continue lisinopril 40 mg daily and hydrochlorothiazide 25 mg daily Cardiovascular risk associated with hypertension discussed Dietary approaches to stop hypertension discussed

## 2023-02-11 NOTE — Patient Instructions (Signed)
Health Maintenance After Age 78 After age 78, you are at a higher risk for certain long-term diseases and infections as well as injuries from falls. Falls are a major cause of broken bones and head injuries in people who are older than age 78. Getting regular preventive care can help to keep you healthy and well. Preventive care includes getting regular testing and making lifestyle changes as recommended by your health care provider. Talk with your health care provider about: Which screenings and tests you should have. A screening is a test that checks for a disease when you have no symptoms. A diet and exercise plan that is right for you. What should I know about screenings and tests to prevent falls? Screening and testing are the best ways to find a health problem early. Early diagnosis and treatment give you the best chance of managing medical conditions that are common after age 78. Certain conditions and lifestyle choices may make you more likely to have a fall. Your health care provider may recommend: Regular vision checks. Poor vision and conditions such as cataracts can make you more likely to have a fall. If you wear glasses, make sure to get your prescription updated if your vision changes. Medicine review. Work with your health care provider to regularly review all of the medicines you are taking, including over-the-counter medicines. Ask your health care provider about any side effects that may make you more likely to have a fall. Tell your health care provider if any medicines that you take make you feel dizzy or sleepy. Strength and balance checks. Your health care provider may recommend certain tests to check your strength and balance while standing, walking, or changing positions. Foot health exam. Foot pain and numbness, as well as not wearing proper footwear, can make you more likely to have a fall. Screenings, including: Osteoporosis screening. Osteoporosis is a condition that causes  the bones to get weaker and break more easily. Blood pressure screening. Blood pressure changes and medicines to control blood pressure can make you feel dizzy. Depression screening. You may be more likely to have a fall if you have a fear of falling, feel depressed, or feel unable to do activities that you used to do. Alcohol use screening. Using too much alcohol can affect your balance and may make you more likely to have a fall. Follow these instructions at home: Lifestyle Do not drink alcohol if: Your health care provider tells you not to drink. If you drink alcohol: Limit how much you have to: 0-1 drink a day for women. 0-2 drinks a day for men. Know how much alcohol is in your drink. In the [place] one drink equals one 12 oz bottle of beer (355 mL), one 5 oz glass of wine (148 mL), or one 1 oz glass of hard liquor (44 mL). Do not use any products that contain nicotine or tobacco. These products include cigarettes, chewing tobacco, and vaping devices, such as e-cigarettes. If you need help quitting, ask your health care provider. Activity  Follow a regular exercise program to stay fit. This will help you maintain your balance. Ask your health care provider what types of exercise are appropriate for you. If you need a cane or walker, use it as recommended by your health care provider. Wear supportive shoes that have nonskid soles. Safety  Remove any tripping hazards, such as rugs, cords, and clutter. Install safety equipment such as grab bars in bathrooms and safety rails on stairs. Keep rooms and walkways   well-lit. General instructions Talk with your health care provider about your risks for falling. Tell your health care provider if: You fall. Be sure to tell your health care provider about all falls, even ones that seem minor. You feel dizzy, tiredness (fatigue), or off-balance. Take over-the-counter and prescription medicines only as told by your health care provider. These include  supplements. Eat a healthy diet and maintain a healthy weight. A healthy diet includes low-fat dairy products, low-fat (lean) meats, and fiber from whole grains, beans, and lots of fruits and vegetables. Stay current with your vaccines. Schedule regular health, dental, and eye exams. Summary Having a healthy lifestyle and getting preventive care can help to protect your health and wellness after age 78. Screening and testing are the best way to find a health problem early and help you avoid having a fall. Early diagnosis and treatment give you the best chance for managing medical conditions that are more common for people who are older than age 78. Falls are a major cause of broken bones and head injuries in people who are older than age 78. Take precautions to prevent a fall at home. Work with your health care provider to learn what changes you can make to improve your health and wellness and to prevent falls. This information is not intended to replace advice given to you by your health care provider. Make sure you discuss any questions you have with your health care provider. Document Revised: 10/30/2020 Document Reviewed: 10/30/2020 Else[employer] Patient Education  2024 [employer]  

## 2023-02-11 NOTE — Assessment & Plan Note (Signed)
Chronic stable condition. Advised to stay well-hydrated and avoid NSAIDs

## 2023-02-11 NOTE — Assessment & Plan Note (Signed)
Chronic stable condition Continues rosuvastatin 10 mg daily

## 2023-02-11 NOTE — Assessment & Plan Note (Signed)
History of breast cancer in the past with successful treatment

## 2023-02-11 NOTE — Progress Notes (Signed)
Gina Ball 77 y.o.   Chief Complaint  Patient presents with   Medical Management of Chronic Issues    F/u appt, patient is requesting a letter/documentation to be cleared to get her [place] license.     HISTORY OF PRESENT ILLNESS: This is a 77 y.o. female here for follow-up of chronic medical conditions Also requesting letter regarding her fitness to drive so she can get [place] driver's license. Regarding driver's license inquiry: Gina Ball was born and raised in [place].  Moved to [facility] in 2019 States she has not had a driver's license for the past 25 years.  Recently she applied for states driver's license and was told she needed medical letter for clearance. She states records show history of "fainting spells" many years ago when she was a resident of [place].  She has no recollection of these.  Thinks it is a mistake. Unable or unwilling to provide details regarding what happened in the past regarding driver's license status. No history of seizure disorder.  No history of EtOH abuse.  No history of syncopal episodes. Overall doing well.  Has no complaints or medical concerns today.  HPI   Prior to Admission medications   Medication Sig Start Date End Date Taking? Authorizing Provider  Ascorbic Acid (VITAMIN C) 1000 MG tablet Take 1,000 mg by mouth daily.   Yes [provider]  aspirin EC 81 MG tablet Take 81 mg by mouth daily.   Yes [provider]  Cyanocobalamin (VITAMIN B 12 PO) Take 100 mg by mouth daily.   Yes [provider]  hydrochlorothiazide (HYDRODIURIL) 25 MG tablet TAKE 1 TABLET EVERY DAY 09/26/22  Yes Gina Quint, MD  lisinopril (ZESTRIL) 40 MG tablet TAKE 1 TABLET EVERY DAY 11/12/22  Yes Gina Ball, Gina Kempf, MD  Multiple Vitamin (MULTIVITAMIN) tablet Take 1 tablet by mouth daily.   Yes [provider]  rosuvastatin (CRESTOR) 10 MG tablet TAKE 1 TABLET EVERY DAY 11/12/22  Yes Gina Ball,  Gina Kempf, MD  traMADol (ULTRAM) 50 MG tablet Take 1 tablet (50 mg total) by mouth every 12 (twelve) hours as needed. 11/12/22  Yes Gina Ball, Gina Kempf, MD  Vitamin D, Cholecalciferol, 25 MCG (1000 UT) TABS Take by mouth daily.   Yes [provider]  montelukast (SINGULAIR) 10 MG tablet TAKE 1 TABLET AT BEDTIME Patient not taking: Reported on 02/11/2023 08/22/21   Gina Quint, MD  Zinc 50 MG CAPS Take by mouth daily.    [provider]    No Known Allergies  Patient Active Problem List   Diagnosis Date Noted   Cancer (HCC) 09/15/2018   Dyslipidemia 12/02/2017   Primary osteoarthritis of right knee 12/02/2017   Essential hypertension, benign 11/13/2017   Chronic pain of right knee 11/13/2017   CAD (coronary artery disease) 10/07/2016   Glaucoma 11/13/2015   Osteopenia 09/05/2015   Stage 3b chronic kidney disease (HCC) 12/13/2014   History of noncompliance with medical treatment, presenting hazards to health 09/15/2014   Pain medication agreement 12/15/2013   Allergic rhinitis 11/22/2011   Hypertension goal BP (blood pressure) < 140/80 11/22/2011   Hx of colonic polyp 11/22/2011   Palpitation 11/22/2011    Past Medical History:  Diagnosis Date   Cancer (HCC)    HAD BREST CANCER 1977   Chronic pain of right knee    H/O myocardial infarction, greater than 8 weeks 1986   reports normal NM Scan   HTN (hypertension)    Hyperlipidemia  Past Surgical History:  Procedure Laterality Date   BREAST SURGERY     BILATERAL    Social History   Socioeconomic History   Marital status: Widowed    Spouse name: Not on file   Number of children: Not on file   Years of education: Not on file   Highest education level: Not on file  Occupational History   Not on file  Tobacco Use   Smoking status: Never   Smokeless tobacco: Never  Vaping Use   Vaping status: Never Used  Substance and Sexual Activity   Alcohol use: Yes    Comment: OCCASIONALLY    Drug use: Not Currently   Sexual activity: Not Currently  Other Topics Concern   Not on file  Social History Narrative   Not on file   Social Determinants of Health   Financial Resource Strain: Low Risk  (04/08/2022)   Overall Financial Resource Strain (CARDIA)    Difficulty of Paying Living Expenses: Not hard at all  Food Insecurity: No Food Insecurity (04/08/2022)   Hunger Vital Sign    Worried About Running Out of Food in the Last Year: Never true    Ran Out of Food in the Last Year: Never true  Transportation Needs: No Transportation Needs (04/08/2022)   PRAPARE - Administrator, Civil Service (Medical): No    Lack of Transportation (Non-Medical): No  Physical Activity: Sufficiently Active (04/08/2022)   Exercise Vital Sign    Days of Exercise per Week: 5 days    Minutes of Exercise per Session: 30 min  Stress: No Stress Concern Present (04/08/2022)   [employer] of Occupational Health - Occupational Stress Questionnaire    Feeling of Stress : Not at all  Social Connections: Moderately Integrated (04/08/2022)   Social Connection and Isolation Panel [NHANES]    Frequency of Communication with Friends and Family: More than three times a week    Frequency of Social Gatherings with Friends and Family: More than three times a week    Attends Religious Services: More than 4 times per year    Active Member of [employer] or Organizations: Yes    Attends Banker Meetings: More than 4 times per year    Marital Status: Widowed  Intimate Partner Violence: Not At Risk (04/08/2022)   Humiliation, Afraid, Rape, and Kick questionnaire    Fear of Current or Ex-Partner: No    Emotionally Abused: No    Physically Abused: No    Sexually Abused: No    Family History  Problem Relation Age of Onset   Healthy Daughter    Lymphoma Son      Review of Systems  Constitutional: Negative.  Negative for chills and fever.  HENT: Negative.  Negative for congestion and  sore throat.   Respiratory: Negative.  Negative for cough and shortness of breath.   Cardiovascular: Negative.  Negative for chest pain and palpitations.  Gastrointestinal:  Negative for abdominal pain, nausea and vomiting.  Skin: Negative.  Negative for rash.  Neurological: Negative.  Negative for dizziness and headaches.  All other systems reviewed and are negative.   Vitals:   02/11/23 0919  BP: 132/78  Pulse: 76  Temp: 97.9 F (36.6 C)  SpO2: 97%    Physical Exam Vitals reviewed.  Constitutional:      Appearance: Normal appearance.  HENT:     Head: Normocephalic.  Eyes:     Extraocular Movements: Extraocular movements intact.     Pupils: Pupils  are equal, round, and reactive to light.  Cardiovascular:     Rate and Rhythm: Normal rate and regular rhythm.     Pulses: Normal pulses.     Heart sounds: Normal heart sounds.  Pulmonary:     Effort: Pulmonary effort is normal.     Breath sounds: Normal breath sounds.  Musculoskeletal:     Cervical back: No tenderness.  Lymphadenopathy:     Cervical: No cervical adenopathy.  Skin:    General: Skin is warm and dry.  Neurological:     General: No focal deficit present.     Mental Status: She is alert and oriented to person, place, and time.  Psychiatric:        Mood and Affect: Mood normal.        Behavior: Behavior normal.      ASSESSMENT & PLAN: A total of 43 minutes was spent with the patient and counseling/coordination of care regarding preparing for this visit, review of most recent office visit notes, review of multiple chronic medical conditions under management, review of most recent blood work results, review of all medications, cardiovascular risks associated with hypertension, education on nutrition, prognosis, documentation and need for follow-up.  Problem List Items Addressed This Visit       Cardiovascular and Mediastinum   Essential hypertension, benign - Primary    BP Readings from Last 3 Encounters:   02/11/23 132/78  05/27/22 124/80  04/08/22 124/76  Well-controlled hypertension Continue lisinopril 40 mg daily and hydrochlorothiazide 25 mg daily Cardiovascular risk associated with hypertension discussed Dietary approaches to stop hypertension discussed       CAD (coronary artery disease)    No recent anginal episodes Continues daily baby aspirin        Musculoskeletal and Integument   Primary osteoarthritis of right knee    With chronic right knee pain Takes tramadol as needed      Relevant Medications   traMADol (ULTRAM) 50 MG tablet     Genitourinary   Stage 3b chronic kidney disease (HCC)    Chronic stable condition.  Advised to stay well-hydrated and avoid NSAIDs        Other   Chronic pain of right knee   Relevant Medications   traMADol (ULTRAM) 50 MG tablet   Dyslipidemia    Chronic stable condition Continues rosuvastatin 10 mg daily      Cancer (HCC)    History of breast cancer in the past with successful treatment      Patient Instructions  Health Maintenance After Age 35 After age 70, you are at a higher risk for certain long-term diseases and infections as well as injuries from falls. Falls are a major cause of broken bones and head injuries in people who are older than age 8. Getting regular preventive care can help to keep you healthy and well. Preventive care includes getting regular testing and making lifestyle changes as recommended by your health care provider. Talk with your health care provider about: Which screenings and tests you should have. A screening is a test that checks for a disease when you have no symptoms. A diet and exercise plan that is right for you. What should I know about screenings and tests to prevent falls? Screening and testing are the best ways to find a health problem early. Early diagnosis and treatment give you the best chance of managing medical conditions that are common after age 27. Certain conditions and  lifestyle choices may make you more likely to  have a fall. Your health care provider may recommend: Regular vision checks. Poor vision and conditions such as cataracts can make you more likely to have a fall. If you wear glasses, make sure to get your prescription updated if your vision changes. Medicine review. Work with your health care provider to regularly review all of the medicines you are taking, including over-the-counter medicines. Ask your health care provider about any side effects that may make you more likely to have a fall. Tell your health care provider if any medicines that you take make you feel dizzy or sleepy. Strength and balance checks. Your health care provider may recommend certain tests to check your strength and balance while standing, walking, or changing positions. Foot health exam. Foot pain and numbness, as well as not wearing proper footwear, can make you more likely to have a fall. Screenings, including: Osteoporosis screening. Osteoporosis is a condition that causes the bones to get weaker and break more easily. Blood pressure screening. Blood pressure changes and medicines to control blood pressure can make you feel dizzy. Depression screening. You may be more likely to have a fall if you have a fear of falling, feel depressed, or feel unable to do activities that you used to do. Alcohol use screening. Using too much alcohol can affect your balance and may make you more likely to have a fall. Follow these instructions at home: Lifestyle Do not drink alcohol if: Your health care provider tells you not to drink. If you drink alcohol: Limit how much you have to: 0-1 drink a day for women. 0-2 drinks a day for men. Know how much alcohol is in your drink. In the [place] one drink equals one 12 oz bottle of beer (355 mL), one 5 oz glass of wine (148 mL), or one 1 oz glass of hard liquor (44 mL). Do not use any products that contain nicotine or tobacco. These products  include cigarettes, chewing tobacco, and vaping devices, such as e-cigarettes. If you need help quitting, ask your health care provider. Activity  Follow a regular exercise program to stay fit. This will help you maintain your balance. Ask your health care provider what types of exercise are appropriate for you. If you need a cane or walker, use it as recommended by your health care provider. Wear supportive shoes that have nonskid soles. Safety  Remove any tripping hazards, such as rugs, cords, and clutter. Install safety equipment such as grab bars in bathrooms and safety rails on stairs. Keep rooms and walkways well-lit. General instructions Talk with your health care provider about your risks for falling. Tell your health care provider if: You fall. Be sure to tell your health care provider about all falls, even ones that seem minor. You feel dizzy, tiredness (fatigue), or off-balance. Take over-the-counter and prescription medicines only as told by your health care provider. These include supplements. Eat a healthy diet and maintain a healthy weight. A healthy diet includes low-fat dairy products, low-fat (lean) meats, and fiber from whole grains, beans, and lots of fruits and vegetables. Stay current with your vaccines. Schedule regular health, dental, and eye exams. Summary Having a healthy lifestyle and getting preventive care can help to protect your health and wellness after age 63. Screening and testing are the best way to find a health problem early and help you avoid having a fall. Early diagnosis and treatment give you the best chance for managing medical conditions that are more common for people who are older than  age 93. Falls are a major cause of broken bones and head injuries in people who are older than age 66. Take precautions to prevent a fall at home. Work with your health care provider to learn what changes you can make to improve your health and wellness and to prevent  falls. This information is not intended to replace advice given to you by your health care provider. Make sure you discuss any questions you have with your health care provider. Document Revised: 10/30/2020 Document Reviewed: 10/30/2020 Elsevier Patient Education  2024 Elsevier Inc.      Edwina Barth, MD [facility]

## 2023-06-02 ENCOUNTER — Other Ambulatory Visit: Payer: Self-pay | Admitting: Emergency Medicine

## 2023-06-02 DIAGNOSIS — G8929 Other chronic pain: Secondary | ICD-10-CM

## 2023-07-07 DIAGNOSIS — I10 Essential (primary) hypertension: Secondary | ICD-10-CM | POA: Diagnosis not present

## 2023-07-07 DIAGNOSIS — J0140 Acute pansinusitis, unspecified: Secondary | ICD-10-CM | POA: Diagnosis not present

## 2023-07-22 ENCOUNTER — Ambulatory Visit: Payer: Medicare HMO | Admitting: Emergency Medicine

## 2023-07-22 ENCOUNTER — Encounter: Payer: Self-pay | Admitting: Emergency Medicine

## 2023-07-22 VITALS — BP 140/78 | HR 81 | Temp 98.2°F | Ht 60.0 in | Wt 139.0 lb

## 2023-07-22 DIAGNOSIS — J0100 Acute maxillary sinusitis, unspecified: Secondary | ICD-10-CM | POA: Diagnosis not present

## 2023-07-22 DIAGNOSIS — R0981 Nasal congestion: Secondary | ICD-10-CM | POA: Diagnosis not present

## 2023-07-22 MED ORDER — AMOXICILLIN-POT CLAVULANATE 875-125 MG PO TABS
1.0000 | ORAL_TABLET | Freq: Two times a day (BID) | ORAL | 0 refills | Status: AC
Start: 2023-07-22 — End: 2023-08-01

## 2023-07-22 MED ORDER — FLUTICASONE PROPIONATE 50 MCG/ACT NA SUSP
2.0000 | Freq: Every day | NASAL | 6 refills | Status: DC
Start: 2023-07-22 — End: 2024-04-05

## 2023-07-22 NOTE — Assessment & Plan Note (Signed)
Recommend to stay away from over-the-counter oral decongestants due to hypertension Recommend to start Flonase twice a day along with frequent saline nasal spray Recommend to rest and stay well-hydrated Advised to contact the office if no better or worse during the next several days

## 2023-07-22 NOTE — Progress Notes (Signed)
Gina Ball 78 y.o.   Chief Complaint  Patient presents with   Follow-up    Patient states she's been having bad sinus congestion for about a month. she been taking Dayquil has not been helping     HISTORY OF PRESENT ILLNESS: Acute problem visit today. This is a 78 y.o. female complaining of sinus pressure and congestion that started about 4 weeks ago, progressively getting worse. Complaining of purulent nasal discharge.  Has been taking DayQuil over-the-counter. Denies headache, fever, chills, nausea or vomiting, or any other associated symptoms   HPI   Prior to Admission medications   Medication Sig Start Date End Date Taking? Authorizing Provider  Ascorbic Acid (VITAMIN C) 1000 MG tablet Take 1,000 mg by mouth daily.   Yes [provider]  aspirin EC 81 MG tablet Take 81 mg by mouth daily.   Yes [provider]  Cyanocobalamin (VITAMIN B 12 PO) Take 100 mg by mouth daily.   Yes [provider]  hydrochlorothiazide (HYDRODIURIL) 25 MG tablet TAKE 1 TABLET EVERY DAY 09/26/22  Yes Georgina Quint, MD  lisinopril (ZESTRIL) 40 MG tablet TAKE 1 TABLET EVERY DAY 11/12/22  Yes Celvin Taney, Eilleen Kempf, MD  Multiple Vitamin (MULTIVITAMIN) tablet Take 1 tablet by mouth daily.   Yes [provider]  rosuvastatin (CRESTOR) 10 MG tablet TAKE 1 TABLET EVERY DAY 11/12/22  Yes Ariellah Faust, Eilleen Kempf, MD  traMADol (ULTRAM) 50 MG tablet Take 1 tablet (50 mg total) by mouth every 12 (twelve) hours as needed. 06/02/23  Yes Jaxxon Naeem, Eilleen Kempf, MD  Vitamin D, Cholecalciferol, 25 MCG (1000 UT) TABS Take by mouth daily.   Yes [provider]  Zinc 50 MG CAPS Take by mouth daily.   Yes [provider]  montelukast (SINGULAIR) 10 MG tablet TAKE 1 TABLET AT BEDTIME Patient not taking: Reported on 07/22/2023 08/22/21   Georgina Quint, MD    No Known Allergies  Patient Active Problem List   Diagnosis Date Noted   Cancer [facility]) 09/15/2018    Dyslipidemia 12/02/2017   Primary osteoarthritis of right knee 12/02/2017   Essential hypertension, benign 11/13/2017   Chronic pain of right knee 11/13/2017   CAD (coronary artery disease) 10/07/2016   Glaucoma 11/13/2015   Osteopenia 09/05/2015   Stage 3b chronic kidney disease (HCC) 12/13/2014   History of noncompliance with medical treatment, presenting hazards to health 09/15/2014   Pain medication agreement 12/15/2013   Hypertension goal BP (blood pressure) < 140/80 11/22/2011   Hx of colonic polyp 11/22/2011   Palpitation 11/22/2011    Past Medical History:  Diagnosis Date   Cancer (HCC)    HAD BREST CANCER 1977   Chronic pain of right knee    H/O myocardial infarction, greater than 8 weeks 1986   reports normal NM Scan   HTN (hypertension)    Hyperlipidemia     Past Surgical History:  Procedure Laterality Date   BREAST SURGERY     BILATERAL    Social History   Socioeconomic History   Marital status: Widowed    Spouse name: Not on file   Number of children: Not on file   Years of education: Not on file   Highest education level: Not on file  Occupational History   Not on file  Tobacco Use   Smoking status: Never   Smokeless tobacco: Never  Vaping Use   Vaping status: Never Used  Substance and Sexual Activity   Alcohol use: Yes    Comment:  OCCASIONALLY   Drug use: Not Currently   Sexual activity: Not Currently  Other Topics Concern   Not on file  Social History Narrative   Not on file   Social Drivers of Health   Financial Resource Strain: Low Risk  (04/08/2022)   Overall Financial Resource Strain (CARDIA)    Difficulty of Paying Living Expenses: Not hard at all  Food Insecurity: No Food Insecurity (04/08/2022)   Hunger Vital Sign    Worried About Running Out of Food in the Last Year: Never true    Ran Out of Food in the Last Year: Never true  Transportation Needs: No Transportation Needs (04/08/2022)   PRAPARE - Scientist, research (physical sciences) (Medical): No    Lack of Transportation (Non-Medical): No  Physical Activity: Sufficiently Active (04/08/2022)   Exercise Vital Sign    Days of Exercise per Week: 5 days    Minutes of Exercise per Session: 30 min  Stress: No Stress Concern Present (04/08/2022)   [employer] of Occupational Health - Occupational Stress Questionnaire    Feeling of Stress : Not at all  Social Connections: Moderately Integrated (04/08/2022)   Social Connection and Isolation Panel [NHANES]    Frequency of Communication with Friends and Family: More than three times a week    Frequency of Social Gatherings with Friends and Family: More than three times a week    Attends Religious Services: More than 4 times per year    Active Member of [employer] or Organizations: Yes    Attends Banker Meetings: More than 4 times per year    Marital Status: Widowed  Intimate Partner Violence: Not At Risk (04/08/2022)   Humiliation, Afraid, Rape, and Kick questionnaire    Fear of Current or Ex-Partner: No    Emotionally Abused: No    Physically Abused: No    Sexually Abused: No    Family History  Problem Relation Age of Onset   Healthy Daughter    Lymphoma Son      Review of Systems  Constitutional:  Negative for chills and fever.  HENT:  Positive for congestion.   Respiratory: Negative.  Negative for cough and shortness of breath.   Cardiovascular: Negative.  Negative for chest pain and palpitations.  Gastrointestinal:  Negative for abdominal pain, nausea and vomiting.  Skin:  Negative for rash.  Neurological:  Negative for dizziness and headaches.  All other systems reviewed and are negative.   Vitals:   07/22/23 0954  BP: (!) 140/78  Pulse: 81  Temp: 98.2 F (36.8 C)  SpO2: 96%    Physical Exam Vitals reviewed.  Constitutional:      Appearance: Normal appearance.  HENT:     Head: Normocephalic.     Right Ear: Tympanic membrane, ear canal and external ear normal.      Left Ear: Tympanic membrane, ear canal and external ear normal.     Nose: Congestion present.     Right Sinus: Maxillary sinus tenderness present.     Left Sinus: Maxillary sinus tenderness present.     Mouth/Throat:     Mouth: Mucous membranes are moist.     Pharynx: Oropharynx is clear.  Eyes:     Extraocular Movements: Extraocular movements intact.     Comments: Mildly injected conjunctiva with mild eyelid swelling bilaterally  Cardiovascular:     Rate and Rhythm: Normal rate and regular rhythm.     Pulses: Normal pulses.     Heart sounds: Normal  heart sounds.  Pulmonary:     Effort: Pulmonary effort is normal.     Breath sounds: Normal breath sounds.  Musculoskeletal:     Cervical back: No tenderness.  Lymphadenopathy:     Cervical: No cervical adenopathy.  Neurological:     Mental Status: She is alert.      ASSESSMENT & PLAN: A total of 32 minutes was spent with the patient and counseling/coordination of care regarding preparing for this visit, review of most recent office visit notes, review of multiple chronic medical conditions under management, review of all medications, diagnosis of acute bacterial sinus infection and need to start antibiotics, symptom management, prognosis, documentation, and need for follow-up if no better or worse during the next several days  Problem List Items Addressed This Visit       Respiratory   Acute non-recurrent maxillary sinusitis - Primary   Secondary bacterial infection. We will start Augmentin twice a day for 7 days.  Symptom management discussed Advised to rest and stay well-hydrated May take Advil dual action as needed for headache or pain      Relevant Medications   amoxicillin-clavulanate (AUGMENTIN) 875-125 MG tablet   fluticasone (FLONASE) 50 MCG/ACT nasal spray   Sinus congestion   Recommend to stay away from over-the-counter oral decongestants due to hypertension Recommend to start Flonase twice a day along with  frequent saline nasal spray Recommend to rest and stay well-hydrated Advised to contact the office if no better or worse during the next several days      Relevant Medications   fluticasone (FLONASE) 50 MCG/ACT nasal spray   Patient Instructions  Sinus Infection, Adult A sinus infection is soreness and swelling (inflammation) of your sinuses. Sinuses are hollow spaces in the bones around your face. They are located: Around your eyes. In the middle of your forehead. Behind your nose. In your cheekbones. Your sinuses and nasal passages are lined with a fluid called mucus. Mucus drains out of your sinuses. Swelling can trap mucus in your sinuses. This lets germs (bacteria, virus, or fungus) grow, which leads to infection. Most of the time, this condition is caused by a virus. What are the causes? Allergies. Asthma. Germs. Things that block your nose or sinuses. Growths in the nose (nasal polyps). Chemicals or irritants in the air. A fungus. This is rare. What increases the risk? Having a weak body defense system (immune system). Doing a lot of swimming or diving. Using nasal sprays too much. Smoking. What are the signs or symptoms? The main symptoms of this condition are pain and a feeling of pressure around the sinuses. Other symptoms include: Stuffy nose (congestion). This may make it hard to breathe through your nose. Runny nose (drainage). Soreness, swelling, and warmth in the sinuses. A cough that may get worse at night. Being unable to smell and taste. Mucus that collects in the throat or the back of the nose (postnasal drip). This may cause a sore throat or bad breath. Being very tired (fatigued). A fever. How is this diagnosed? Your symptoms. Your medical history. A physical exam. Tests to find out if your condition is short-term (acute) or long-term (chronic). Your doctor may: Check your nose for growths (polyps). Check your sinuses using a tool that has a light on  one end (endoscope). Check for allergies or germs. Do imaging tests, such as an MRI or CT scan. How is this treated? Treatment for this condition depends on the cause and whether it is short-term or long-term.  If caused by a virus, your symptoms should go away on their own within 10 days. You may be given medicines to relieve symptoms. They include: Medicines that shrink swollen tissue in the nose. A spray that treats swelling of the nostrils. Rinses that help get rid of thick mucus in your nose (nasal saline washes). Medicines that treat allergies (antihistamines). Over-the-counter pain relievers. If caused by bacteria, your doctor may wait to see if you will get better without treatment. You may be given antibiotic medicine if you have: A very bad infection. A weak body defense system. If caused by growths in the nose, surgery may be needed. Follow these instructions at home: Medicines Take, use, or apply over-the-counter and prescription medicines only as told by your doctor. These may include nasal sprays. If you were prescribed an antibiotic medicine, take it as told by your doctor. Do not stop taking it even if you start to feel better. Hydrate and humidify  Drink enough water to keep your pee (urine) pale yellow. Use a cool mist humidifier to keep the humidity level in your home above 50%. Breathe in steam for 10-15 minutes, 3-4 times a day, or as told by your doctor. You can do this in the bathroom while a hot shower is running. Try not to spend time in cool or dry air. Rest Rest as much as you can. Sleep with your head raised (elevated). Make sure you get enough sleep each night. General instructions  Put a warm, moist washcloth on your face 3-4 times a day, or as often as told by your doctor. Use nasal saline washes as often as told by your doctor. Wash your hands often with soap and water. If you cannot use soap and water, use hand sanitizer. Do not smoke. Avoid being  around people who are smoking (secondhand smoke). Keep all follow-up visits. Contact a doctor if: You have a fever. Your symptoms get worse. Your symptoms do not get better within 10 days. Get help right away if: You have a very bad headache. You cannot stop vomiting. You have very bad pain or swelling around your face or eyes. You have trouble seeing. You feel confused. Your neck is stiff. You have trouble breathing. These symptoms may be an emergency. Get help right away. Call 911. Do not wait to see if the symptoms will go away. Do not drive yourself to the hospital. Summary A sinus infection is swelling of your sinuses. Sinuses are hollow spaces in the bones around your face. This condition is caused by tissues in your nose that become inflamed or swollen. This traps germs. These can lead to infection. If you were prescribed an antibiotic medicine, take it as told by your doctor. Do not stop taking it even if you start to feel better. Keep all follow-up visits. This information is not intended to replace advice given to you by your health care provider. Make sure you discuss any questions you have with your health care provider. Document Revised: 05/15/2021 Document Reviewed: 05/15/2021 Elsevier Patient Education  2024 Elsevier Inc.    Edwina Barth, MD [facility]

## 2023-07-22 NOTE — Patient Instructions (Signed)

## 2023-07-22 NOTE — Assessment & Plan Note (Signed)
Secondary bacterial infection. We will start Augmentin twice a day for 7 days.  Symptom management discussed Advised to rest and stay well-hydrated May take Advil dual action as needed for headache or pain

## 2023-08-02 ENCOUNTER — Other Ambulatory Visit: Payer: Self-pay | Admitting: Emergency Medicine

## 2023-08-02 DIAGNOSIS — G8929 Other chronic pain: Secondary | ICD-10-CM

## 2023-08-11 ENCOUNTER — Ambulatory Visit: Payer: Medicare HMO | Admitting: Emergency Medicine

## 2023-08-19 NOTE — Progress Notes (Signed)
 This encounter was created in error - please disregard.  Called pt X3/No ANSWER/ LDM for pt to call office and reschedule AWV.

## 2023-10-07 ENCOUNTER — Ambulatory Visit

## 2023-10-07 VITALS — Ht 60.0 in | Wt 139.0 lb

## 2023-10-07 DIAGNOSIS — Z0000 Encounter for general adult medical examination without abnormal findings: Secondary | ICD-10-CM

## 2023-10-07 DIAGNOSIS — Z1159 Encounter for screening for other viral diseases: Secondary | ICD-10-CM

## 2023-10-07 NOTE — Progress Notes (Signed)
 Subjective:   Gina Ball is a 78 y.o. who presents for a Medicare Wellness preventive visit.  Visit Complete: Virtual I connected with  Alphonsus Sias on 10/07/23 by a audio enabled telemedicine application and verified that I am speaking with the correct person using two identifiers.  Patient Location: Home  Provider Location: Office/Clinic  I discussed the limitations of evaluation and management by telemedicine. The patient expressed understanding and agreed to proceed.  Vital Signs: Because this visit was a virtual/telehealth visit, some criteria may be missing or patient reported. Any vitals not documented were not able to be obtained and vitals that have been documented are patient reported.  VideoDeclined- This patient declined Librarian, academic. Therefore the visit was completed with audio only.  Persons Participating in Visit: Patient.  AWV Questionnaire: No: Patient Medicare AWV questionnaire was not completed prior to this visit.  Cardiac Risk Factors include: advanced age (>57men, >21 women);hypertension;dyslipidemia     Objective:    Today's Vitals   10/07/23 1052  Weight: 139 lb (63 kg)  Height: 5' (1.524 m)   Body mass index is 27.15 kg/m.     10/07/2023   10:48 AM 04/08/2022    3:57 PM 03/29/2021    2:52 PM 03/22/2020   11:05 AM 03/22/2019   11:09 AM 08/17/2018    1:31 PM  Advanced Directives  Does Patient Have a Medical Advance Directive? No No Yes Yes No No  Type of Advance Directive   Living will;Healthcare Power of [employer] Power of Attorney    Does patient want to make changes to medical advance directive?   No - Patient declined     Copy of Healthcare Power of Attorney in Chart?   No - copy requested No - copy requested    Would patient like information on creating a medical advance directive? Yes (MAU/Ambulatory/Procedural Areas - Information given) No - Patient declined   No - Patient declined Yes (ED -  Information included in AVS)    Current Medications (verified) Outpatient Encounter Medications as of 10/07/2023  Medication Sig   Ascorbic Acid (VITAMIN C) 1000 MG tablet Take 1,000 mg by mouth daily.   aspirin EC 81 MG tablet Take 81 mg by mouth daily.   fluticasone (FLONASE) 50 MCG/ACT nasal spray Place 2 sprays into both nostrils daily.   hydrochlorothiazide (HYDRODIURIL) 25 MG tablet TAKE 1 TABLET EVERY DAY   lisinopril (ZESTRIL) 40 MG tablet TAKE 1 TABLET EVERY DAY   Multiple Vitamin (MULTIVITAMIN) tablet Take 1 tablet by mouth daily.   rosuvastatin (CRESTOR) 10 MG tablet TAKE 1 TABLET EVERY DAY   traMADol (ULTRAM) 50 MG tablet Take 1 tablet (50 mg total) by mouth every 12 (twelve) hours as needed.   Vitamin D, Cholecalciferol, 25 MCG (1000 UT) TABS Take by mouth daily.   Zinc 50 MG CAPS Take by mouth daily.   [DISCONTINUED] Cyanocobalamin (VITAMIN B 12 PO) Take 100 mg by mouth daily.   [DISCONTINUED] montelukast (SINGULAIR) 10 MG tablet TAKE 1 TABLET AT BEDTIME   No facility-administered encounter medications on file as of 10/07/2023.    Allergies (verified) Patient has no known allergies.   History: Past Medical History:  Diagnosis Date   Cancer (HCC)    HAD BREST CANCER 1977   Chronic pain of right knee    H/O myocardial infarction, greater than 8 weeks 1986   reports normal NM Scan   HTN (hypertension)    Hyperlipidemia    Past Surgical History:  Procedure Laterality Date   BREAST SURGERY     BILATERAL   Family History  Problem Relation Age of Onset   Healthy Daughter    Lymphoma Son    Social History   Socioeconomic History   Marital status: Widowed    Spouse name: Not on file   Number of children: Not on file   Years of education: Not on file   Highest education level: Not on file  Occupational History   Not on file  Tobacco Use   Smoking status: Never    Passive exposure: Never   Smokeless tobacco: Never  Vaping Use   Vaping status: Never Used   Substance and Sexual Activity   Alcohol use: Not Currently   Drug use: Not Currently   Sexual activity: Not Currently  Other Topics Concern   Not on file  Social History Narrative   Widowed   Social Drivers of Health   Financial Resource Strain: Low Risk  (10/07/2023)   Overall Financial Resource Strain (CARDIA)    Difficulty of Paying Living Expenses: Not hard at all  Food Insecurity: No Food Insecurity (10/07/2023)   Hunger Vital Sign    Worried About Running Out of Food in the Last Year: Never true    Ran Out of Food in the Last Year: Never true  Transportation Needs: No Transportation Needs (10/07/2023)   PRAPARE - Administrator, Civil Service (Medical): No    Lack of Transportation (Non-Medical): No  Physical Activity: Sufficiently Active (10/07/2023)   Exercise Vital Sign    Days of Exercise per Week: 7 days    Minutes of Exercise per Session: 60 min  Stress: No Stress Concern Present (10/07/2023)   [employer] of Occupational Health - Occupational Stress Questionnaire    Feeling of Stress : Not at all  Social Connections: Moderately Integrated (10/07/2023)   Social Connection and Isolation Panel [NHANES]    Frequency of Communication with Friends and Family: More than three times a week    Frequency of Social Gatherings with Friends and Family: More than three times a week    Attends Religious Services: More than 4 times per year    Active Member of [employer] or Organizations: Yes    Attends Banker Meetings: More than 4 times per year    Marital Status: Widowed    Tobacco Counseling Counseling given: No    Clinical Intake:  Pre-visit preparation completed: Yes  Pain : No/denies pain     BMI - recorded: 27.15 Nutritional Risks: None Diabetes: No  No results found for: "HGBA1C"   How often do you need to have someone help you when you read instructions, pamphlets, or other written materials from your doctor or pharmacy?: 1 -  Never  Interpreter Needed?: No  Information entered by :: Hassell Halim, CMA   Activities of Daily Living     10/07/2023   10:55 AM  In your present state of health, do you have any difficulty performing the following activities:  Hearing? 0  Vision? 0  Difficulty concentrating or making decisions? 0  Walking or climbing stairs? 0  Dressing or bathing? 0  Doing errands, shopping? 0  Preparing Food and eating ? N  Using the Toilet? N  In the past six months, have you accidently leaked urine? N  Do you have problems with loss of bowel control? N  Managing your Medications? N  Managing your Finances? N  Housekeeping or managing your Housekeeping? N  Patient Care Team: Georgina Quint, MD as PCP - General (Internal Medicine)  Indicate any recent Medical Services you may have received from other than [facility] providers in the past year (date may be approximate).     Assessment:   This is a routine wellness examination for [place].  Hearing/Vision screen Hearing Screening - Comments:: Denies hearing difficulties   Vision Screening - Comments:: Wears rx glasses - up to date with routine eye exams with [facility]   Goals Addressed               This Visit's Progress     Patient Stated (pt-stated)        Patient stated she plans to stay active.       Depression Screen     10/07/2023   11:00 AM 07/22/2023    9:59 AM 02/11/2023    9:27 AM 05/27/2022   10:18 AM 04/08/2022    3:45 PM 10/02/2021   10:40 AM 04/03/2021   10:24 AM  PHQ 2/9 Scores  PHQ - 2 Score 0 0 0 0 0 0 0  PHQ- 9 Score 0          Fall Risk     10/07/2023   10:56 AM 07/22/2023    9:59 AM 02/11/2023    9:27 AM 05/27/2022   10:18 AM 04/08/2022    3:46 PM  Fall Risk   Falls in the past year? 0 0 0 0 0  Number falls in past yr: 0 0 0 0 0  Injury with Fall? 0 0 0 0 0  Risk for fall due to : No Fall Risks No Fall Risks No Fall Risks No Fall Risks No Fall Risks  Follow up Falls prevention  discussed;Falls evaluation completed Falls evaluation completed Falls evaluation completed Falls evaluation completed Falls prevention discussed    MEDICARE RISK AT HOME:  Medicare Risk at Home Any stairs in or around the home?: No If so, are there any without handrails?: No Home free of loose throw rugs in walkways, pet beds, electrical cords, etc?: Yes Adequate lighting in your home to reduce risk of falls?: Yes Life alert?: No Use of a cane, walker or w/c?: No Grab bars in the bathroom?: Yes Shower chair or bench in shower?: Yes Elevated toilet seat or a handicapped toilet?: No  TIMED UP AND GO:  Was the test performed?  No  Cognitive Function: 6CIT completed        10/07/2023   10:56 AM 04/08/2022    3:46 PM 03/22/2020   11:06 AM 03/22/2019   11:10 AM  6CIT Screen  What Year? 0 points 0 points 0 points 0 points  What month? 0 points 0 points 0 points 0 points  What time? 0 points 0 points 0 points 0 points  Count back from 20 0 points 0 points 0 points 0 points  Months in reverse 0 points 0 points 0 points 0 points  Repeat phrase 6 points 0 points 0 points 0 points  Total Score 6 points 0 points 0 points 0 points    Immunizations Immunization History  Administered Date(s) Administered   Influenza, Seasonal, Injecte, Preservative Fre 04/03/2015   Influenza,inj,quad, With Preservative 03/05/2012   Influenza-Unspecified 05/08/2005, 05/08/2006, 06/13/2008, 04/24/2009, 04/13/2010   PFIZER(Purple Top)SARS-COV-2 Vaccination 09/09/2019, 10/10/2019, 05/05/2020   Pneumococcal Conjugate-13 04/03/2015   Pneumococcal Polysaccharide-23 04/13/2010   Tdap 04/18/2010    Screening Tests Health Maintenance  Topic Date Due   Hepatitis C Screening  Never done   COVID-19 Vaccine (4 - 2024-25 season) 02/23/2023   Medicare Annual Wellness (AWV)  10/06/2024   Pneumonia Vaccine 52+ Years old  Completed   DEXA SCAN  Completed   HPV VACCINES  Aged Out   Meningococcal B Vaccine  Aged  Out   DTaP/Tdap/Td  Discontinued   INFLUENZA VACCINE  Discontinued   Zoster Vaccines- Shingrix  Discontinued    Health Maintenance  Health Maintenance Due  Topic Date Due   Hepatitis C Screening  Never done   COVID-19 Vaccine (4 - 2024-25 season) 02/23/2023   Health Maintenance Items Addressed: 10/07/2023 Hepatitis C Screening  Additional Screening:  Vision Screening: Recommended annual ophthalmology exams for early detection of glaucoma and other disorders of the eye. Pt stated she gets her routine eye exams at the local [facility].  Dental Screening: Recommended annual dental exams for proper oral hygiene  Community Resource Referral / Chronic Care Management: CRR required this visit?  No   CCM required this visit?  No     Plan:     I have personally reviewed and noted the following in the patient's chart:   Medical and social history Use of alcohol, tobacco or illicit drugs  Current medications and supplements including opioid prescriptions. Patient is currently taking opioid prescriptions. Information provided to patient regarding non-opioid alternatives. Patient advised to discuss non-opioid treatment plan with their provider. Functional ability and status Nutritional status Physical activity Advanced directives List of other physicians Hospitalizations, surgeries, and ER visits in previous 12 months Vitals Screenings to include cognitive, depression, and falls Referrals and appointments  In addition, I have reviewed and discussed with patient certain preventive protocols, quality metrics, and best practice recommendations. A written personalized care plan for preventive services as well as general preventive health recommendations were provided to patient.     Patria Bookbinder, CMA   10/07/2023   After Visit Summary: (Mail) Due to this being a telephonic visit, the after visit summary with patients personalized plan was offered to patient via mail    Notes: Nothing significant to report at this time.

## 2023-10-07 NOTE — Patient Instructions (Addendum)
 Gina Ball , Thank you for taking time to come for your Medicare Wellness Visit. I appreciate your ongoing commitment to your health goals. Please review the following plan we discussed and let me know if I can assist you in the future.   Referrals/Orders/Follow-Ups/Clinician Recommendations: Aim for 30 minutes of exercise or brisk walking, 6-8 glasses of water, and 5 servings of fruits and vegetables each day.  Educated and advised on getting labwork (Hepatitis C Screening) at lab (1st floor) in 2025.  This is a list of the screening recommended for you and due dates:  Health Maintenance  Topic Date Due   Hepatitis C Screening  Never done   COVID-19 Vaccine (4 - 2024-25 season) 02/23/2023   Medicare Annual Wellness Visit  10/06/2024   Pneumonia Vaccine  Completed   DEXA scan (bone density measurement)  Completed   HPV Vaccine  Aged Out   Meningitis B Vaccine  Aged Out   DTaP/Tdap/Td vaccine  Discontinued   Flu Shot  Discontinued   Zoster (Shingles) Vaccine  Discontinued    Advanced directives: (Provided) Advance directive discussed with you today. I have provided a copy for you to complete at home and have notarized. Once this is complete, please bring a copy in to our office so we can scan it into your chart.   Next Medicare Annual Wellness Visit scheduled for next year: Yes  Managing Pain Without Opioids Opioids are strong medicines used to treat moderate to severe pain. For some people, especially those who have long-term (chronic) pain, opioids may not be the best choice for pain management due to: Side effects like nausea, constipation, and sleepiness. The risk of addiction (opioid use disorder). The longer you take opioids, the greater your risk of addiction. Pain that lasts for more than 3 months is called chronic pain. Managing chronic pain usually requires more than one approach and is often provided by a team of health care providers working together (multidisciplinary  approach). Pain management may be done at a pain management center or pain clinic. How to manage pain without the use of opioids Use non-opioid medicines Non-opioid medicines for pain may include: Over-the-counter or prescription non-steroidal anti-inflammatory drugs (NSAIDs). These may be the first medicines used for pain. They work well for muscle and bone pain, and they reduce swelling. Acetaminophen. This over-the-counter medicine may work well for milder pain but not swelling. Antidepressants. These may be used to treat chronic pain. A certain type of antidepressant (tricyclics) is often used. These medicines are given in lower doses for pain than when used for depression. Anticonvulsants. These are usually used to treat seizures but may also reduce nerve (neuropathic) pain. Muscle relaxants. These relieve pain caused by sudden muscle tightening (spasms). You may also use a pain medicine that is applied to the skin as a patch, cream, or gel (topical analgesic), such as a numbing medicine. These may cause fewer side effects than medicines taken by mouth. Do certain therapies as directed Some therapies can help with pain management. They include: Physical therapy. You will do exercises to gain strength and flexibility. A physical therapist may teach you exercises to move and stretch parts of your body that are weak, stiff, or painful. You can learn these exercises at physical therapy visits and practice them at home. Physical therapy may also involve: Massage. Heat wraps or applying heat or cold to affected areas. Electrical signals that interrupt pain signals (transcutaneous electrical nerve stimulation, TENS). Weak lasers that reduce pain and swelling (low-level  laser therapy). Signals from your body that help you learn to regulate pain (biofeedback). Occupational therapy. This helps you to learn ways to function at home and work with less pain. Recreational therapy. This involves trying new  activities or hobbies, such as a physical activity or drawing. Mental health therapy, including: Cognitive behavioral therapy (CBT). This helps you learn coping skills for dealing with pain. Acceptance and commitment therapy (ACT) to change the way you think and react to pain. Relaxation therapies, including muscle relaxation exercises and mindfulness-based stress reduction. Pain management counseling. This may be individual, family, or group counseling.  Receive medical treatments Medical treatments for pain management include: Nerve block injections. These may include a pain blocker and anti-inflammatory medicines. You may have injections: Near the spine to relieve chronic back or neck pain. Into joints to relieve back or joint pain. Into nerve areas that supply a painful area to relieve body pain. Into muscles (trigger point injections) to relieve some painful muscle conditions. A medical device placed near your spine to help block pain signals and relieve nerve pain or chronic back pain (spinal cord stimulation device). Acupuncture. Follow these instructions at home Medicines Take over-the-counter and prescription medicines only as told by your health care provider. If you are taking pain medicine, ask your health care providers about possible side effects to watch out for. Do not drive or use heavy machinery while taking prescription opioid pain medicine. Lifestyle  Do not use drugs or alcohol to reduce pain. If you drink alcohol, limit how much you have to: 0-1 drink a day for women who are not pregnant. 0-2 drinks a day for men. Know how much alcohol is in a drink. In the [place] one drink equals one 12 oz bottle of beer (355 mL), one 5 oz glass of wine (148 mL), or one 1 oz glass of hard liquor (44 mL). Do not use any products that contain nicotine or tobacco. These products include cigarettes, chewing tobacco, and vaping devices, such as e-cigarettes. If you need help quitting, ask  your health care provider. Eat a healthy diet and maintain a healthy weight. Poor diet and excess weight may make pain worse. Eat foods that are high in fiber. These include fresh fruits and vegetables, whole grains, and beans. Limit foods that are high in fat and processed sugars, such as fried and sweet foods. Exercise regularly. Exercise lowers stress and may help relieve pain. Ask your health care provider what activities and exercises are safe for you. If your health care provider approves, join an exercise class that combines movement and stress reduction. Examples include yoga and tai chi. Get enough sleep. Lack of sleep may make pain worse. Lower stress as much as possible. Practice stress reduction techniques as told by your therapist. General instructions Work with all your pain management providers to find the treatments that work best for you. You are an important member of your pain management team. There are many things you can do to reduce pain on your own. Consider joining an online or in-person support group for people who have chronic pain. Keep all follow-up visits. This is important. Where to find more information You can find more information about managing pain without opioids from: American Academy of Pain Medicine: painmed.org Institute for Chronic Pain: instituteforchronicpain.org American Chronic Pain Association: theacpa.org Contact a health care provider if: You have side effects from pain medicine. Your pain gets worse or does not get better with treatments or home therapy. You are struggling  with anxiety or depression. Summary Many types of pain can be managed without opioids. Chronic pain may respond better to pain management without opioids. Pain is best managed when you and a team of health care providers work together. Pain management without opioids may include non-opioid medicines, medical treatments, physical therapy, mental health therapy, and lifestyle  changes. Tell your health care providers if your pain gets worse or is not being managed well enough. This information is not intended to replace advice given to you by your health care provider. Make sure you discuss any questions you have with your health care provider. Document Revised: 09/20/2020 Document Reviewed: 09/20/2020 Elsevier Patient Education  2024 [employer].

## 2023-10-27 ENCOUNTER — Other Ambulatory Visit: Payer: Self-pay | Admitting: Emergency Medicine

## 2023-10-27 DIAGNOSIS — G8929 Other chronic pain: Secondary | ICD-10-CM

## 2023-10-27 NOTE — Telephone Encounter (Signed)
 Copied from CRM [id number]. Topic: Clinical - Medication Refill >> Oct 27, 2023  2:50 PM Rosaria Common wrote: Most Recent Primary Care Visit:  Provider: Patria Bookbinder  Department: [facility]  Visit Type: ANNUAL WELL VISIT, SEQUENTIAL  Date: 10/07/2023  Medication: traMADol  (ULTRAM ) 50 MG tablet  Has the patient contacted their pharmacy? Yes (Agent: If no, request that the patient contact the pharmacy for the refill. If patient does not wish to contact the pharmacy document the reason why and proceed with request.) (Agent: If yes, when and what did the pharmacy advise?)  Is this the correct pharmacy for this prescription? Yes If no, delete pharmacy and type the correct one.  This is the patient's preferred pharmacy:  [facility] - [place], [place] - [address] [place] [address] Phone: [phone] Fax: [phone]   Has the prescription been filled recently? Yes.   Is the patient out of the medication? Yes  Has the patient been seen for an appointment in the last year OR does the patient have an upcoming appointment? Yes  Can we respond through MyChart? Yes  Agent: Please be advised that Rx refills may take up to 3 business days. We ask that you follow-up with your pharmacy.

## 2023-10-27 NOTE — Telephone Encounter (Signed)
 Last Fill: 08/04/23  Last OV: 07/22/23 Next OV: 10/11/24 AWV  Routing to provider for review/authorization.

## 2023-10-28 MED ORDER — TRAMADOL HCL 50 MG PO TABS
50.0000 mg | ORAL_TABLET | Freq: Two times a day (BID) | ORAL | 1 refills | Status: DC | PRN
Start: 2023-10-28 — End: 2023-12-22

## 2023-10-30 ENCOUNTER — Other Ambulatory Visit: Payer: Self-pay | Admitting: Emergency Medicine

## 2023-10-30 DIAGNOSIS — I10 Essential (primary) hypertension: Secondary | ICD-10-CM

## 2023-11-20 DIAGNOSIS — H18419 Arcus senilis, unspecified eye: Secondary | ICD-10-CM | POA: Diagnosis not present

## 2023-11-20 DIAGNOSIS — H5203 Hypermetropia, bilateral: Secondary | ICD-10-CM | POA: Diagnosis not present

## 2023-11-20 DIAGNOSIS — H524 Presbyopia: Secondary | ICD-10-CM | POA: Diagnosis not present

## 2023-11-20 DIAGNOSIS — H52209 Unspecified astigmatism, unspecified eye: Secondary | ICD-10-CM | POA: Diagnosis not present

## 2023-11-20 DIAGNOSIS — H2510 Age-related nuclear cataract, unspecified eye: Secondary | ICD-10-CM | POA: Diagnosis not present

## 2023-11-21 DIAGNOSIS — Z0100 Encounter for examination of eyes and vision without abnormal findings: Secondary | ICD-10-CM | POA: Diagnosis not present

## 2023-11-22 ENCOUNTER — Other Ambulatory Visit: Payer: Self-pay | Admitting: Emergency Medicine

## 2023-11-22 DIAGNOSIS — E785 Hyperlipidemia, unspecified: Secondary | ICD-10-CM

## 2023-12-22 ENCOUNTER — Encounter: Payer: Self-pay | Admitting: Emergency Medicine

## 2023-12-22 ENCOUNTER — Ambulatory Visit (INDEPENDENT_AMBULATORY_CARE_PROVIDER_SITE_OTHER): Admitting: Emergency Medicine

## 2023-12-22 VITALS — BP 128/80 | HR 88 | Temp 99.0°F | Ht 60.0 in | Wt 138.0 lb

## 2023-12-22 DIAGNOSIS — M1711 Unilateral primary osteoarthritis, right knee: Secondary | ICD-10-CM

## 2023-12-22 DIAGNOSIS — G8929 Other chronic pain: Secondary | ICD-10-CM | POA: Diagnosis not present

## 2023-12-22 DIAGNOSIS — R1010 Upper abdominal pain, unspecified: Secondary | ICD-10-CM | POA: Insufficient documentation

## 2023-12-22 DIAGNOSIS — E785 Hyperlipidemia, unspecified: Secondary | ICD-10-CM

## 2023-12-22 DIAGNOSIS — N1832 Chronic kidney disease, stage 3b: Secondary | ICD-10-CM

## 2023-12-22 DIAGNOSIS — I2510 Atherosclerotic heart disease of native coronary artery without angina pectoris: Secondary | ICD-10-CM | POA: Diagnosis not present

## 2023-12-22 DIAGNOSIS — M25561 Pain in right knee: Secondary | ICD-10-CM | POA: Diagnosis not present

## 2023-12-22 DIAGNOSIS — I10 Essential (primary) hypertension: Secondary | ICD-10-CM | POA: Diagnosis not present

## 2023-12-22 LAB — URINALYSIS
Bilirubin Urine: NEGATIVE
Hgb urine dipstick: NEGATIVE
Ketones, ur: NEGATIVE
Leukocytes,Ua: NEGATIVE
Nitrite: NEGATIVE
Specific Gravity, Urine: 1.02 (ref 1.000–1.030)
Total Protein, Urine: NEGATIVE
Urine Glucose: NEGATIVE
Urobilinogen, UA: 0.2 (ref 0.0–1.0)
pH: 6 (ref 5.0–8.0)

## 2023-12-22 LAB — COMPREHENSIVE METABOLIC PANEL WITH GFR
ALT: 18 U/L (ref 0–35)
AST: 22 U/L (ref 0–37)
Albumin: 4.3 g/dL (ref 3.5–5.2)
Alkaline Phosphatase: 35 U/L — ABNORMAL LOW (ref 39–117)
BUN: 36 mg/dL — ABNORMAL HIGH (ref 6–23)
CO2: 23 meq/L (ref 19–32)
Calcium: 9.5 mg/dL (ref 8.4–10.5)
Chloride: 107 meq/L (ref 96–112)
Creatinine, Ser: 1.3 mg/dL — ABNORMAL HIGH (ref 0.40–1.20)
GFR: 39.25 mL/min — ABNORMAL LOW (ref >60.00)
Glucose, Bld: 97 mg/dL (ref 70–99)
Potassium: 3.8 meq/L (ref 3.5–5.1)
Sodium: 139 meq/L (ref 135–145)
Total Bilirubin: 0.5 mg/dL (ref 0.2–1.2)
Total Protein: 7.4 g/dL (ref 6.0–8.3)

## 2023-12-22 LAB — LIPID PANEL
Cholesterol: 159 mg/dL (ref 0–200)
HDL: 65.9 mg/dL (ref >39.00)
LDL Cholesterol: 70 mg/dL (ref 0–99)
NonHDL: 92.66
Total CHOL/HDL Ratio: 2
Triglycerides: 111 mg/dL (ref 0.0–149.0)
VLDL: 22.2 mg/dL (ref 0.0–40.0)

## 2023-12-22 LAB — HEMOGLOBIN A1C: Hgb A1c MFr Bld: 5.9 % (ref 4.6–6.5)

## 2023-12-22 MED ORDER — TRAMADOL HCL 50 MG PO TABS: 50.0000 mg | ORAL_TABLET | Freq: Two times a day (BID) | ORAL | 1 refills | Status: DC | PRN

## 2023-12-22 NOTE — Assessment & Plan Note (Signed)
 Chronic stable condition. Advised to stay well-hydrated and avoid NSAIDs

## 2023-12-22 NOTE — Assessment & Plan Note (Signed)
Chronic stable condition Continues rosuvastatin 10 mg daily

## 2023-12-22 NOTE — Assessment & Plan Note (Signed)
With chronic right knee pain Takes tramadol as needed

## 2023-12-22 NOTE — Assessment & Plan Note (Signed)
 Continues to take tramadol  50 mg as needed for pain. Withdrawal symptoms in the differential diagnosis of abdominal pain with diarrhea

## 2023-12-22 NOTE — Assessment & Plan Note (Signed)
 Clinically stable.  No red flag signs or symptoms Much improved today.  Benign abdominal examination. Stable vital signs.  Afebrile. Differential diagnosis discussed with patient. Recommend blood work today Recommend CT scan abdomen pelvis Diet and nutrition discussed ED precautions given Pain management discussed Advised to contact the office if no better or worse during the next several days.

## 2023-12-22 NOTE — Assessment & Plan Note (Signed)
 BP Readings from Last 3 Encounters:  12/22/23 128/80  07/22/23 (!) 140/78  02/11/23 132/78  Well-controlled hypertension Continue lisinopril  40 mg daily and hydrochlorothiazide  25 mg daily Cardiovascular risk associated with hypertension discussed Dietary approaches to stop hypertension discussed

## 2023-12-22 NOTE — Progress Notes (Signed)
 Gina Ball 78 y.o.   Chief Complaint  Patient presents with   Abdominal Pain    Patient states her abdominal pain started Thursday, says its has gotten better yesterday. She states she did have loose bowels not diarrhea. But stools are back to normal and her abdominal pain is feeling a lot better.       HISTORY OF PRESENT ILLNESS: This is a 78 y.o. female complaining of upper abdominal pain that started last Thursday morning and persisted throughout the day with loose bowel movements Did not eat much.  Appetite was not there.  No nausea or vomiting.  No fever or chills.  Symptoms persisted for the next 48 hours but by Sunday was much better Asymptomatic today.  Abdominal Pain Associated symptoms include diarrhea. Pertinent negatives include no dysuria, fever or headaches.     Prior to Admission medications   Medication Sig Start Date End Date Taking? Authorizing Provider  Ascorbic Acid (VITAMIN C) 1000 MG tablet Take 1,000 mg by mouth daily.   Yes [provider]  aspirin EC 81 MG tablet Take 81 mg by mouth daily.   Yes [provider]  fluticasone  (FLONASE ) 50 MCG/ACT nasal spray Place 2 sprays into both nostrils daily. 07/22/23  Yes Jamiere Gulas Jose, MD  hydrochlorothiazide  (HYDRODIURIL ) 25 MG tablet TAKE 1 TABLET EVERY DAY 10/30/23  Yes Jakavion Bilodeau Jose, MD  lisinopril  (ZESTRIL ) 40 MG tablet TAKE 1 TABLET EVERY DAY 11/22/23  Yes Avion Patella, Emil Schanz, MD  Multiple Vitamin (MULTIVITAMIN) tablet Take 1 tablet by mouth daily.   Yes [provider]  rosuvastatin  (CRESTOR ) 10 MG tablet TAKE 1 TABLET EVERY DAY 11/12/22  Yes Zailey Audia, Emil Schanz, MD  Vitamin D, Cholecalciferol, 25 MCG (1000 UT) TABS Take by mouth daily.   Yes [provider]  Zinc 50 MG CAPS Take by mouth daily.   Yes [provider]  traMADol  (ULTRAM ) 50 MG tablet Take 1 tablet (50 mg total) by mouth every 12 (twelve) hours as needed. 12/22/23   Purcell Emil Schanz,  MD    No Known Allergies  Patient Active Problem List   Diagnosis Date Noted   Cancer [facility]) 09/15/2018   Dyslipidemia 12/02/2017   Primary osteoarthritis of right knee 12/02/2017   Essential hypertension, benign 11/13/2017   Chronic pain of right knee 11/13/2017   CAD (coronary artery disease) 10/07/2016   Glaucoma 11/13/2015   Osteopenia 09/05/2015   Stage 3b chronic kidney disease (HCC) 12/13/2014   History of noncompliance with medical treatment, presenting hazards to health 09/15/2014   Pain medication agreement 12/15/2013   Hypertension goal BP (blood pressure) < 140/80 11/22/2011   Hx of colonic polyp 11/22/2011   Palpitation 11/22/2011    Past Medical History:  Diagnosis Date   Cancer (HCC)    HAD BREST CANCER 1977   Chronic pain of right knee    H/O myocardial infarction, greater than 8 weeks 1986   reports normal NM Scan   HTN (hypertension)    Hyperlipidemia     Past Surgical History:  Procedure Laterality Date   BREAST SURGERY     BILATERAL    Social History   Socioeconomic History   Marital status: Widowed    Spouse name: Not on file   Number of children: Not on file   Years of education: Not on file   Highest education level: Not on file  Occupational History   Not on file  Tobacco Use   Smoking status: Never  Passive exposure: Never   Smokeless tobacco: Never  Vaping Use   Vaping status: Never Used  Substance and Sexual Activity   Alcohol use: Not Currently   Drug use: Not Currently   Sexual activity: Not Currently  Other Topics Concern   Not on file  Social History Narrative   Widowed   Social Drivers of Health   Financial Resource Strain: Low Risk  (10/07/2023)   Overall Financial Resource Strain (CARDIA)    Difficulty of Paying Living Expenses: Not hard at all  Food Insecurity: No Food Insecurity (10/07/2023)   Hunger Vital Sign    Worried About Running Out of Food in the Last Year: Never true    Ran Out of Food in the Last  Year: Never true  Transportation Needs: No Transportation Needs (10/07/2023)   PRAPARE - Administrator, Civil Service (Medical): No    Lack of Transportation (Non-Medical): No  Physical Activity: Sufficiently Active (10/07/2023)   Exercise Vital Sign    Days of Exercise per Week: 7 days    Minutes of Exercise per Session: 60 min  Stress: No Stress Concern Present (10/07/2023)   [employer] of Occupational Health - Occupational Stress Questionnaire    Feeling of Stress : Not at all  Social Connections: Moderately Integrated (10/07/2023)   Social Connection and Isolation Panel    Frequency of Communication with Friends and Family: More than three times a week    Frequency of Social Gatherings with Friends and Family: More than three times a week    Attends Religious Services: More than 4 times per year    Active Member of [employer] or Organizations: Yes    Attends Banker Meetings: More than 4 times per year    Marital Status: Widowed  Intimate Partner Violence: Not At Risk (10/07/2023)   Humiliation, Afraid, Rape, and Kick questionnaire    Fear of Current or Ex-Partner: No    Emotionally Abused: No    Physically Abused: No    Sexually Abused: No    Family History  Problem Relation Age of Onset   Healthy Daughter    Lymphoma Son      Review of Systems  Constitutional: Negative.  Negative for chills and fever.  HENT: Negative.  Negative for congestion and sore throat.   Respiratory: Negative.  Negative for cough and shortness of breath.   Cardiovascular: Negative.  Negative for chest pain and palpitations.  Gastrointestinal:  Positive for abdominal pain and diarrhea.  Genitourinary: Negative.  Negative for dysuria and urgency.  Skin: Negative.  Negative for rash.  Neurological: Negative.  Negative for dizziness and headaches.  All other systems reviewed and are negative.   Vitals:   12/22/23 1053  BP: 128/80  Pulse: 88  Temp: 99 F (37.2 C)   SpO2: 96%    Physical Exam Vitals reviewed.  Constitutional:      Appearance: She is well-developed.  HENT:     Head: Normocephalic.     Mouth/Throat:     Mouth: Mucous membranes are moist.     Pharynx: Oropharynx is clear.   Eyes:     Extraocular Movements: Extraocular movements intact.     Pupils: Pupils are equal, round, and reactive to light.    Cardiovascular:     Rate and Rhythm: Normal rate and regular rhythm.     Pulses: Normal pulses.     Heart sounds: Normal heart sounds.  Pulmonary:     Effort: Pulmonary effort is normal.  Breath sounds: Normal breath sounds.  Abdominal:     Palpations: Abdomen is soft.     Tenderness: There is no abdominal tenderness. There is no right CVA tenderness, left CVA tenderness or guarding.   Musculoskeletal:     Cervical back: No tenderness.  Lymphadenopathy:     Cervical: No cervical adenopathy.   Skin:    General: Skin is warm and dry.     Capillary Refill: Capillary refill takes less than 2 seconds.   Neurological:     General: No focal deficit present.     Mental Status: She is alert and oriented to person, place, and time.   Psychiatric:        Mood and Affect: Mood normal.        Behavior: Behavior normal.      ASSESSMENT & PLAN: A total of 42 minutes was spent with the patient and counseling/coordination of care regarding preparing for this visit, review of most recent office visit notes, review of multiple chronic medical conditions and their management, differential diagnosis of upper abdominal pain and need for workup including CT scan of abdomen and pelvis, pain management, review of all medications, review of most recent bloodwork results, review of health maintenance items, education on nutrition, prognosis, documentation, and need for follow up.   Problem List Items Addressed This Visit       Cardiovascular and Mediastinum   Essential hypertension, benign   BP Readings from Last 3 Encounters:   12/22/23 128/80  07/22/23 (!) 140/78  02/11/23 132/78  Well-controlled hypertension Continue lisinopril  40 mg daily and hydrochlorothiazide  25 mg daily Cardiovascular risk associated with hypertension discussed Dietary approaches to stop hypertension discussed       CAD (coronary artery disease)   No recent anginal episodes Continues daily baby aspirin        Musculoskeletal and Integument   Primary osteoarthritis of right knee   With chronic right knee pain Takes tramadol  as needed      Relevant Medications   traMADol  (ULTRAM ) 50 MG tablet     Genitourinary   Stage 3b chronic kidney disease (HCC)   Chronic stable condition.  Advised to stay well-hydrated and avoid NSAIDs          Other   Chronic pain of right knee   Continues to take tramadol  50 mg as needed for pain. Withdrawal symptoms in the differential diagnosis of abdominal pain with diarrhea      Relevant Medications   traMADol  (ULTRAM ) 50 MG tablet   Dyslipidemia   Chronic stable condition Continues rosuvastatin  10 mg daily      Pain of upper abdomen - Primary   Clinically stable.  No red flag signs or symptoms Much improved today.  Benign abdominal examination. Stable vital signs.  Afebrile. Differential diagnosis discussed with patient. Recommend blood work today Recommend CT scan abdomen pelvis Diet and nutrition discussed ED precautions given Pain management discussed Advised to contact the office if no better or worse during the next several days.      Relevant Orders   Comprehensive metabolic panel with GFR   CBC with Differential/Platelet   Hemoglobin A1c   Lipid panel   Urinalysis   CT ABDOMEN PELVIS W WO CONTRAST   Patient Instructions  Abdominal Pain, Adult  Many things can cause belly (abdominal) pain. In most cases, belly pain is not a serious problem and can be watched and treated at home. But in some cases, it can be serious. Your doctor will try  to find the cause of your  belly pain. Follow these instructions at home: Medicines Take over-the-counter and prescription medicines only as told by your doctor. Do not take medicines that help you poop (laxatives) unless told by your doctor. General instructions Watch your belly pain for any changes. Tell your doctor if the pain gets worse. Drink enough fluid to keep your pee (urine) pale yellow. Contact a doctor if: Your belly pain changes or gets worse. You have very bad cramping or bloating in your belly. You vomit. Your pain gets worse with meals, after eating, or with certain foods. You have trouble pooping or have watery poop for more than 2-3 days. You are not hungry, or you lose weight without trying. You have signs of not getting enough fluid or water (dehydration). These may include: Dark pee, very little pee, or no pee. Cracked lips or dry mouth. Feeling sleepy or weak. You have pain when you pee or poop. Your belly pain wakes you up at night. You have blood in your pee. You have a fever. Get help right away if: You cannot stop vomiting. Your pain is only in one part of your belly, like on the right side. You have bloody or black poop, or poop that looks like tar. You have trouble breathing. You have chest pain. These symptoms may be an emergency. Get help right away. Call 911. Do not wait to see if the symptoms will go away. Do not drive yourself to the hospital. This information is not intended to replace advice given to you by your health care provider. Make sure you discuss any questions you have with your health care provider. Document Revised: 03/27/2022 Document Reviewed: 03/27/2022 Elsevier Patient Education  2024 Elsevier Inc.     Emil Schaumann, MD [facility]

## 2023-12-22 NOTE — Assessment & Plan Note (Signed)
No recent anginal episodes Continues daily baby aspirin

## 2023-12-22 NOTE — Patient Instructions (Signed)
 Abdominal Pain, Adult    Many things can cause belly (abdominal) pain. In most cases, belly pain is not a serious problem and can be watched and treated at home. But in some cases, it can be serious.  Your doctor will try to find the cause of your belly pain.  Follow these instructions at home:  Medicines  Take over-the-counter and prescription medicines only as told by your doctor.  Do not take medicines that help you poop (laxatives) unless told by your doctor.  General instructions  Watch your belly pain for any changes. Tell your doctor if the pain gets worse.  Drink enough fluid to keep your pee (urine) pale yellow.  Contact a doctor if:  Your belly pain changes or gets worse.  You have very bad cramping or bloating in your belly.  You vomit.  Your pain gets worse with meals, after eating, or with certain foods.  You have trouble pooping or have watery poop for more than 2-3 days.  You are not hungry, or you lose weight without trying.  You have signs of not getting enough fluid or water (dehydration). These may include:  Dark pee, very Chastain pee, or no pee.  Cracked lips or dry mouth.  Feeling sleepy or weak.  You have pain when you pee or poop.  Your belly pain wakes you up at night.  You have blood in your pee.  You have a fever.  Get help right away if:  You cannot stop vomiting.  Your pain is only in one part of your belly, like on the right side.  You have bloody or black poop, or poop that looks like tar.  You have trouble breathing.  You have chest pain.  These symptoms may be an emergency. Get help right away. Call 911.  Do not wait to see if the symptoms will go away.  Do not drive yourself to the hospital.  This information is not intended to replace advice given to you by your health care provider. Make sure you discuss any questions you have with your health care provider.  Document Revised: 03/27/2022 Document Reviewed: 03/27/2022  Elsevier Patient Education  2024 [employer].

## 2023-12-23 ENCOUNTER — Ambulatory Visit: Payer: Self-pay | Admitting: Emergency Medicine

## 2023-12-23 LAB — CBC WITH DIFFERENTIAL/PLATELET
Basophils Absolute: 0 10*3/uL (ref 0.0–0.1)
Basophils Relative: 0.8 % (ref 0.0–3.0)
Eosinophils Absolute: 0.1 10*3/uL (ref 0.0–0.7)
Eosinophils Relative: 2.4 % (ref 0.0–5.0)
HCT: 33.8 % — ABNORMAL LOW (ref 36.0–46.0)
Hemoglobin: 11.2 g/dL — ABNORMAL LOW (ref 12.0–15.0)
Lymphocytes Relative: 27.7 % (ref 12.0–46.0)
Lymphs Abs: 1.5 10*3/uL (ref 0.7–4.0)
MCHC: 33.1 g/dL (ref 30.0–36.0)
MCV: 95.6 fl (ref 78.0–100.0)
Monocytes Absolute: 0.4 10*3/uL (ref 0.1–1.0)
Monocytes Relative: 8.4 % (ref 3.0–12.0)
Neutro Abs: 3.2 10*3/uL (ref 1.4–7.7)
Neutrophils Relative %: 60.7 % (ref 43.0–77.0)
Platelets: 235 10*3/uL (ref 150.0–400.0)
RBC: 3.54 Mil/uL — ABNORMAL LOW (ref 3.87–5.11)
RDW: 13.8 % (ref 11.5–15.5)
WBC: 5.3 10*3/uL (ref 4.0–10.5)

## 2023-12-24 NOTE — Telephone Encounter (Signed)
 Copied from CRM [id number]. Topic: Clinical - Lab/Test Results >> Dec 24, 2023  9:11 AM Thersia BROCKS wrote: Reason for CRM: Patient called in regarding missed call about lab results, relay message, patient understood and had no further questions

## 2023-12-31 ENCOUNTER — Other Ambulatory Visit

## 2024-01-21 ENCOUNTER — Other Ambulatory Visit: Payer: Self-pay | Admitting: Emergency Medicine

## 2024-01-21 DIAGNOSIS — E785 Hyperlipidemia, unspecified: Secondary | ICD-10-CM

## 2024-01-22 ENCOUNTER — Telehealth: Payer: Self-pay | Admitting: Emergency Medicine

## 2024-01-22 NOTE — Progress Notes (Unsigned)
 Virtual Visit via Video Note  I connected with Gina Ball on 01/23/24 at  8:20 AM EDT by a video enabled telemedicine application and verified that I am speaking with the correct person using two identifiers.  Patient Location: Home Provider Location: Office/Clinic  I discussed the limitations, risks, security, and privacy concerns of performing an evaluation and management service by video and the availability of in person appointments. I also discussed with the patient that there may be a patient responsible charge related to this service. The patient expressed understanding and agreed to proceed.  Subjective: PCP: Purcell Emil Schanz, MD  No chief complaint on file.  HPI 78 yo F presents for evaluation of possible sinus infection for the last 9 days.  Has tried dayquil, flonase  with no relief.  Reports sinus pain and pressure. Denies known sick contacts.  Denies abdominal pain, nausea, vomiting, diarrhea, rash, fever, other concerns.    ROS: Per HPI  Current Outpatient Medications:    amoxicillin -clavulanate (AUGMENTIN ) 875-125 MG tablet, Take 1 tablet by mouth 2 (two) times daily., Disp: 20 tablet, Rfl: 0   Ascorbic Acid (VITAMIN C) 1000 MG tablet, Take 1,000 mg by mouth daily., Disp: , Rfl:    aspirin EC 81 MG tablet, Take 81 mg by mouth daily., Disp: , Rfl:    fluticasone  (FLONASE ) 50 MCG/ACT nasal spray, Place 2 sprays into both nostrils daily., Disp: 16 g, Rfl: 6   hydrochlorothiazide  (HYDRODIURIL ) 25 MG tablet, TAKE 1 TABLET EVERY DAY, Disp: 90 tablet, Rfl: 3   lisinopril  (ZESTRIL ) 40 MG tablet, TAKE 1 TABLET EVERY DAY, Disp: 90 tablet, Rfl: 3   Multiple Vitamin (MULTIVITAMIN) tablet, Take 1 tablet by mouth daily., Disp: , Rfl:    rosuvastatin  (CRESTOR ) 10 MG tablet, TAKE 1 TABLET EVERY DAY, Disp: 90 tablet, Rfl: 3   traMADol  (ULTRAM ) 50 MG tablet, Take 1 tablet (50 mg total) by mouth every 12 (twelve) hours as needed., Disp: 30 tablet, Rfl: 1   Vitamin D,  Cholecalciferol, 25 MCG (1000 UT) TABS, Take by mouth daily., Disp: , Rfl:    Zinc 50 MG CAPS, Take by mouth daily., Disp: , Rfl:   Observations/Objective: There were no vitals filed for this visit. Physical Exam Vitals and nursing note reviewed.  Constitutional:      General: She is not in acute distress.    Comments: Sounds congested, appears fatigued  HENT:     Head: Normocephalic and atraumatic.  Eyes:     Extraocular Movements: Extraocular movements intact.  Pulmonary:     Effort: Pulmonary effort is normal.  Musculoskeletal:     Cervical back: Normal range of motion.  Neurological:     General: No focal deficit present.     Mental Status: She is alert and oriented to person, place, and time.  Psychiatric:        Mood and Affect: Mood normal.        Behavior: Behavior normal.     Assessment and Plan: Bacterial sinusitis -     Amoxicillin -Pot Clavulanate; Take 1 tablet by mouth 2 (two) times daily.  Dispense: 20 tablet; Refill: 0  Nasal congestion  Continue flonase  prn  Follow Up Instructions: Return if symptoms worsen or fail to improve.   I discussed the assessment and treatment plan with the patient. The patient was provided an opportunity to ask questions, and all were answered. The patient agreed with the plan and demonstrated an understanding of the instructions.   The patient was advised to call  back or seek an in-person evaluation if the symptoms worsen or if the condition fails to improve as anticipated.  I spent 12 minutes on this patient encounter including counseling, diagnosis, treatment options, documentation, face-to-face time.   The above assessment and management plan was discussed with the patient. The patient verbalized understanding of and has agreed to the management plan.   Corean LITTIE Ku, FNP

## 2024-01-22 NOTE — Telephone Encounter (Signed)
 Copied from CRM [id number]. Topic: Clinical - Medication Question >> Jan 22, 2024  8:36 AM Berneda FALCON wrote: Reason for CRM: Patient states she does not want an appt, does not feel well enough for the drive. Wants to know if PCP can call in  medication for her sinus infection, runny nose, coughing, sneezing, for over a week. She would like an antibiotic if possible please.  Patient callback is [phone]  If so, patient would like the [facility]  Address: [address] [place], [place], [place] [address] Phone: [phone]

## 2024-01-23 ENCOUNTER — Encounter: Payer: Self-pay | Admitting: Family Medicine

## 2024-01-23 ENCOUNTER — Telehealth (INDEPENDENT_AMBULATORY_CARE_PROVIDER_SITE_OTHER): Admitting: Family Medicine

## 2024-01-23 DIAGNOSIS — J329 Chronic sinusitis, unspecified: Secondary | ICD-10-CM

## 2024-01-23 DIAGNOSIS — B9689 Other specified bacterial agents as the cause of diseases classified elsewhere: Secondary | ICD-10-CM | POA: Insufficient documentation

## 2024-01-23 DIAGNOSIS — R0981 Nasal congestion: Secondary | ICD-10-CM | POA: Diagnosis not present

## 2024-01-23 MED ORDER — AMOXICILLIN-POT CLAVULANATE 875-125 MG PO TABS: 1.0000 | ORAL_TABLET | Freq: Two times a day (BID) | ORAL | 0 refills | Status: AC

## 2024-01-23 NOTE — Telephone Encounter (Signed)
 Patient had virtual visit today with NP Alvia

## 2024-01-23 NOTE — Patient Instructions (Signed)
 I have sent in Augmentin for you to take twice a day for 10 days.  This medication can upset your stomach, so I tell everyone to take it with a meal.  Follow-up with me for new or worsening symptoms.

## 2024-01-23 NOTE — Telephone Encounter (Signed)
 The overwhelming majority of sinus infections are viral and do not need antibiotics.  Recommend to treat the symptoms at this point.

## 2024-02-24 ENCOUNTER — Other Ambulatory Visit: Payer: Self-pay | Admitting: Emergency Medicine

## 2024-02-24 DIAGNOSIS — G8929 Other chronic pain: Secondary | ICD-10-CM

## 2024-04-04 ENCOUNTER — Other Ambulatory Visit: Payer: Self-pay | Admitting: Emergency Medicine

## 2024-04-04 DIAGNOSIS — R0981 Nasal congestion: Secondary | ICD-10-CM

## 2024-04-14 ENCOUNTER — Other Ambulatory Visit: Payer: Self-pay | Admitting: Emergency Medicine

## 2024-04-14 DIAGNOSIS — G8929 Other chronic pain: Secondary | ICD-10-CM

## 2024-05-05 ENCOUNTER — Encounter: Payer: Self-pay | Admitting: Pharmacist

## 2024-05-05 NOTE — Progress Notes (Signed)
 Pharmacy Quality Measure Review  This patient is appearing on a report for being at risk of failing the adherence measure for cholesterol (statin) medications this calendar year.   Medication: Rosuvastatin  Last fill date: 04/23/24 for 90 day supply  Reviewed fill history and MAC PDC is 80% for the year. Insurance report was not up to date. No action needed at this time.   Darrelyn Drum, PharmD, BCPS, CPP Clinical Pharmacist Practitioner [facility] at [facility] [phone]

## 2024-06-07 ENCOUNTER — Ambulatory Visit: Payer: Self-pay

## 2024-06-07 NOTE — Telephone Encounter (Signed)
 FYI Only or Action Required?: Action required by provider: requesting atbx.  Patient was last seen in primary care on 01/23/2024 by Alvia Corean CROME, FNP.  Called Nurse Triage reporting Facial Pain.  Symptoms began a week ago.  Interventions attempted: OTC medications: dayquil. allergra.  Symptoms are: gradually worsening.  Triage Disposition: See HCP Within 4 Hours (Or PCP Triage)  Patient/caregiver understands and will follow disposition?: No, wishes to speak with PCP   Copied from CRM #[id number]. Topic: Clinical - Medication Question >> Jun 07, 2024 10:23 AM Robinson H wrote: Reason for CRM: Patient states she has a sinus infection and wants to know if Dr. Purcell will call her in some antibiotics again to the [facility] [address] [facility] pharmacy on file, patient states he has called her in this medication before in the pass for same issues.   [id number] - [place], [place] - [address] AT [facility] [address] & [address] [phone] [address] [place] [address]  Hani [phone] Reason for Disposition  [1] SEVERE sinus pain (e.g., excruciating) AND [2] not improved 2 hours after pain medicine  Answer Assessment - Initial Assessment Questions Sinus pressure and headache. She states that this continue to reoccur. Pain is 7-8/10 having thick tan mucus that drains down throat in middle of the night causing her to wake up and feels likes she is chocking or coughing. Looking at chart it looks like she was seen for this is August, atbx recent in October. Rn recommended an appt as this continue to reoccur and she states she needs to know why this keeps happening. But pt declined appt stating she can't drive right now with the pain and discomfort and just wants an atbx sent in. Rn advised Dr. Delynn may say that she needs to come in to be evaluated at this continues to reoccur about every 2 months. Pt stated understanding    1. LOCATION: Where does it hurt?       Forehead and under eyes 2. ONSET: When did the sinus pain start?  (e.g., hours, days)      On and off for about 4 months, this last one a week or so 3. SEVERITY: How bad is the pain?   (Scale 0-10; or none, mild, moderate or severe)     7-8 4. RECURRENT SYMPTOM: Have you ever had sinus problems before? If Yes, ask: When was the last time? and What happened that time?      Yes, abtx 5. NASAL CONGESTION: Is the nose blocked? If Yes, ask: Can you open it or must you breathe through your mouth?     Yes and is blowing nose 6. NASAL DISCHARGE: Do you have discharge from your nose? If so ask, What color?     Yes thick tan color 7. FEVER: Do you have a fever? If Yes, ask: What is it, how was it measured, and when did it start?      denies 8. OTHER SYMPTOMS: Do you have any other symptoms? (e.g., sore throat, cough, earache, difficulty breathing)     denies  Protocols used: Sinus Pain or Congestion-A-AH

## 2024-06-07 NOTE — Telephone Encounter (Signed)
 Needs office visit.  Any provider can see her.

## 2024-06-07 NOTE — Telephone Encounter (Signed)
 Please advise

## 2024-06-08 NOTE — Telephone Encounter (Signed)
 Called patient and LVM 1st attempt informed patient that she needs to be seen

## 2024-06-11 NOTE — Telephone Encounter (Signed)
 2nd attempt contacting the patient LVM

## 2024-06-14 ENCOUNTER — Other Ambulatory Visit: Payer: Self-pay | Admitting: Emergency Medicine

## 2024-06-14 DIAGNOSIS — G8929 Other chronic pain: Secondary | ICD-10-CM

## 2024-06-14 NOTE — Telephone Encounter (Signed)
 3rd attempt contacting patient LVM as well. Closing this encounter

## 2024-06-29 ENCOUNTER — Encounter: Payer: Self-pay | Admitting: Emergency Medicine

## 2024-06-29 ENCOUNTER — Ambulatory Visit (INDEPENDENT_AMBULATORY_CARE_PROVIDER_SITE_OTHER): Admitting: Emergency Medicine

## 2024-06-29 VITALS — BP 126/82 | HR 89 | Temp 97.9°F | Ht 60.0 in | Wt 141.0 lb

## 2024-06-29 DIAGNOSIS — J309 Allergic rhinitis, unspecified: Secondary | ICD-10-CM

## 2024-06-29 DIAGNOSIS — N1832 Chronic kidney disease, stage 3b: Secondary | ICD-10-CM | POA: Diagnosis not present

## 2024-06-29 DIAGNOSIS — E785 Hyperlipidemia, unspecified: Secondary | ICD-10-CM | POA: Diagnosis not present

## 2024-06-29 DIAGNOSIS — I10 Essential (primary) hypertension: Secondary | ICD-10-CM | POA: Diagnosis not present

## 2024-06-29 MED ORDER — IPRATROPIUM BROMIDE 0.06 % NA SOLN: 2.0000 | Freq: Three times a day (TID) | NASAL | 12 refills | Status: AC

## 2024-06-29 MED ORDER — LEVOCETIRIZINE DIHYDROCHLORIDE 5 MG PO TABS: 5.0000 mg | ORAL_TABLET | Freq: Every evening | ORAL | 1 refills | Status: AC

## 2024-06-29 NOTE — Assessment & Plan Note (Signed)
 Active and affecting quality of life Over-the-counter Benadryl, Claritin not working Recommend Xyzal  5 mg daily Has been using Flonase .  Continue using it Start Atrovent  nasal spray 2-3 times a day Recommend evaluation by allergist.  Referral placed today. Recommend Nettie pot and frequent use of saline nasal sprays

## 2024-06-29 NOTE — Patient Instructions (Signed)
 Allergic Rhinitis, Adult  Allergic rhinitis is a reaction to allergens. Allergens are things that can cause an allergic reaction. This condition affects the lining inside the nose (mucous membrane). There are two types of allergic rhinitis: Seasonal. This type is also called hay fever. It happens only during some times of the year. Perennial. This type can happen at any time of the year. This condition cannot be spread from person to person (is not contagious). It can be mild, bad, or very bad. It can develop at any age and may be outgrown. What are the causes? Pollen from grasses, trees, and weeds. Other causes can be: Dust mites. Smoke. Mold. Car fumes. The pee (urine), spit, or dander of pets. Dander is dead skin cells from a pet. What increases the risk? You are more likely to develop this condition if: You have allergies in your family. You have problems like allergies in your family. You may have: Swelling of parts of your eyes and eyelids. Asthma. This affects how you breathe. Long-term redness and swelling on your skin. Food allergies. What are the signs or symptoms? The main symptom of this condition is a runny or stuffy nose (nasal congestion). Other symptoms may include: Sneezing or coughing. Itching and tearing of your eyes. Mucus that drips down the back of your throat (postnasal drip). This may cause a sore throat. Trouble sleeping. Feeling tired. Headache. How is this treated? There is no cure for this condition. You should avoid things that you are allergic to. Treatment can help to relieve symptoms. This may include: Medicines that block allergy symptoms, such as anti-inflammatories or antihistamines. These may be given as a shot, nasal spray, or pill. Avoiding things you are allergic to. Medicines that give you some of what you are allergic to over time. This is called immunotherapy. It is done if other treatments do not help. You may get: Shots. Medicine under  your tongue. Stronger medicines, if other treatments do not help. Follow these instructions at home: Avoiding allergens Find out what things you are allergic to and avoid them. To do this, try these things: If you get allergies any time of year: Replace carpet with wood, tile, or vinyl flooring. Carpet can trap pet dander and dust. Do not smoke. Do not allow smoking in your home. Change your heating and air conditioning filters at least once a month. If you get allergies only some times of the year: Keep windows closed when you can. Plan things to do outside when pollen counts are lowest. Check pollen counts before you plan things to do outside. When you come indoors, change your clothes and shower before you sit on furniture or bedding. If you are allergic to a pet: Keep the pet out of your bedroom. Vacuum, sweep, and dust often. General instructions Take over-the-counter and prescription medicines only as told by your doctor. Drink enough fluid to keep your pee pale yellow. Where to find more information American Academy of Allergy, Asthma & Immunology: aaaai.org Contact a doctor if: You have a fever. You get a cough that does not go away. You make high-pitched whistling sounds when you breathe, most often when you breathe out (wheeze). Your symptoms slow you down. Your symptoms stop you from doing your normal things each day. Get help right away if: You are short of breath. This symptom may be an emergency. Get help right away. Call 911. Do not wait to see if the symptom will go away. Do not drive yourself to the  hospital. This information is not intended to replace advice given to you by your health care provider. Make sure you discuss any questions you have with your health care provider. Document Revised: 02/18/2022 Document Reviewed: 02/18/2022 Elsevier Patient Education  2024 [employer].

## 2024-06-29 NOTE — Assessment & Plan Note (Signed)
 Chronic stable condition. Advised to stay well-hydrated and avoid NSAIDs

## 2024-06-29 NOTE — Progress Notes (Signed)
 Gina Ball 79 y.o.   Chief Complaint  Patient presents with   Follow-up    HISTORY OF PRESENT ILLNESS: This is a 79 y.o. female complaining of chronic nasal congestion and runny nose along with sneezing worse the last couple of months. Tried over-the-counter antihistamines and Flonase  but symptoms still persist No other associated symptoms No other complaints or medical concerns today.  HPI   Prior to Admission medications  Medication Sig Start Date End Date Taking? Authorizing Provider  amoxicillin -clavulanate (AUGMENTIN ) 875-125 MG tablet Take 1 tablet by mouth 2 (two) times daily. 01/23/24  Yes Alvia Corean CROME, FNP  Ascorbic Acid (VITAMIN C) 1000 MG tablet Take 1,000 mg by mouth daily.   Yes [provider]  aspirin EC 81 MG tablet Take 81 mg by mouth daily.   Yes [provider]  fluticasone  (FLONASE ) 50 MCG/ACT nasal spray PLACE 2 SPRAYS INTO BOTH NOSTRILS ONCE DAILY 04/05/24  Yes Jalaiya Oyster, Emil Schanz, MD  hydrochlorothiazide  (HYDRODIURIL ) 25 MG tablet TAKE 1 TABLET EVERY DAY 10/30/23  Yes Shenell Rogalski Jose, MD  ipratropium (ATROVENT ) 0.06 % nasal spray Place 2 sprays into both nostrils 3 (three) times daily. 06/29/24  Yes Scotlyn Mccranie, Emil Schanz, MD  levocetirizine (XYZAL ) 5 MG tablet Take 1 tablet (5 mg total) by mouth every evening. 06/29/24  Yes Purcell Emil Schanz, MD  lisinopril  (ZESTRIL ) 40 MG tablet TAKE 1 TABLET EVERY DAY 11/22/23  Yes Christo Hain, Emil Schanz, MD  Multiple Vitamin (MULTIVITAMIN) tablet Take 1 tablet by mouth daily.   Yes [provider]  rosuvastatin  (CRESTOR ) 10 MG tablet TAKE 1 TABLET EVERY DAY 01/21/24  Yes Telitha Plath, Emil Schanz, MD  traMADol  (ULTRAM ) 50 MG tablet Take 1 tablet (50 mg total) by mouth 2 (two) times daily. 06/15/24  Yes Webb, Padonda B, FNP  Vitamin D, Cholecalciferol, 25 MCG (1000 UT) TABS Take by mouth daily.   Yes [provider]  Zinc 50 MG CAPS Take by mouth daily.   Yes [provider]    Allergies[1]  Patient Active Problem List   Diagnosis Date Noted   Cancer (HCC) 09/15/2018   Dyslipidemia 12/02/2017   Primary osteoarthritis of right knee 12/02/2017   Essential hypertension, benign 11/13/2017   Chronic pain of right knee 11/13/2017   CAD (coronary artery disease) 10/07/2016   Glaucoma 11/13/2015   Osteopenia 09/05/2015   Stage 3b chronic kidney disease (HCC) 12/13/2014   History of noncompliance with medical treatment, presenting hazards to health 09/15/2014   Pain medication agreement 12/15/2013   Hypertension goal BP (blood pressure) < 140/80 11/22/2011   Hx of colonic polyp 11/22/2011    Past Medical History:  Diagnosis Date   Cancer (HCC)    HAD BREST CANCER 1977   Chronic pain of right knee    H/O myocardial infarction, greater than 8 weeks 1986   reports normal NM Scan   HTN (hypertension)    Hyperlipidemia     Past Surgical History:  Procedure Laterality Date   BREAST SURGERY     BILATERAL    Social History   Socioeconomic History   Marital status: Widowed    Spouse name: Not on file   Number of children: Not on file   Years of education: Not on file   Highest education level: Not on file  Occupational History   Not on file  Tobacco Use   Smoking status: Never    Passive exposure: Never   Smokeless tobacco: Never  Vaping Use   Vaping status:  Never Used  Substance and Sexual Activity   Alcohol use: Not Currently   Drug use: Not Currently   Sexual activity: Not Currently  Other Topics Concern   Not on file  Social History Narrative   Widowed   Social Drivers of Health   Tobacco Use: Low Risk (06/29/2024)   Patient History    Smoking Tobacco Use: Never    Smokeless Tobacco Use: Never    Passive Exposure: Never  Financial Resource Strain: Low Risk (10/07/2023)   Overall Financial Resource Strain (CARDIA)    Difficulty of Paying Living Expenses: Not hard at all  Food Insecurity: No Food Insecurity (10/07/2023)   Hunger  Vital Sign    Worried About Running Out of Food in the Last Year: Never true    Ran Out of Food in the Last Year: Never true  Transportation Needs: No Transportation Needs (10/07/2023)   PRAPARE - Administrator, Civil Service (Medical): No    Lack of Transportation (Non-Medical): No  Physical Activity: Sufficiently Active (10/07/2023)   Exercise Vital Sign    Days of Exercise per Week: 7 days    Minutes of Exercise per Session: 60 min  Stress: No Stress Concern Present (10/07/2023)   [employer] of Occupational Health - Occupational Stress Questionnaire    Feeling of Stress : Not at all  Social Connections: Moderately Integrated (10/07/2023)   Social Connection and Isolation Panel    Frequency of Communication with Friends and Family: More than three times a week    Frequency of Social Gatherings with Friends and Family: More than three times a week    Attends Religious Services: More than 4 times per year    Active Member of [employer] or Organizations: Yes    Attends Banker Meetings: More than 4 times per year    Marital Status: Widowed  Intimate Partner Violence: Not At Risk (10/07/2023)   Humiliation, Afraid, Rape, and Kick questionnaire    Fear of Current or Ex-Partner: No    Emotionally Abused: No    Physically Abused: No    Sexually Abused: No  Depression (PHQ2-9): Low Risk (06/29/2024)   Depression (PHQ2-9)    PHQ-2 Score: 0  Alcohol Screen: Low Risk (10/07/2023)   Alcohol Screen    Last Alcohol Screening Score (AUDIT): 0  Housing: Unknown (10/07/2023)   Housing Stability Vital Sign    Unable to Pay for Housing in the Last Year: No    Number of Times Moved in the Last Year: Not on file    Homeless in the Last Year: No  Utilities: Not At Risk (10/07/2023)   [facility] Utilities    Threatened with loss of utilities: No  Health Literacy: Adequate Health Literacy (10/07/2023)   B1300 Health Literacy    Frequency of need for help with medical instructions:  Never    Family History  Problem Relation Age of Onset   Healthy Daughter    Lymphoma Son      Review of Systems  Constitutional: Negative.  Negative for chills and fever.  HENT:  Positive for congestion. Negative for sore throat.   Respiratory: Negative.  Negative for cough, shortness of breath and wheezing.   Cardiovascular: Negative.  Negative for chest pain and palpitations.  Gastrointestinal:  Negative for abdominal pain, diarrhea, nausea and vomiting.  Genitourinary: Negative.  Negative for dysuria and hematuria.  Skin:  Negative for rash.  Neurological:  Negative for dizziness and headaches.  Endo/Heme/Allergies:  Positive for environmental allergies.  All other systems reviewed and are negative.   Vitals:   06/29/24 1316  BP: 126/82  Pulse: 89  Temp: 97.9 F (36.6 C)  SpO2: 97%    Physical Exam Vitals reviewed.  Constitutional:      Appearance: Normal appearance.  HENT:     Head: Normocephalic.     Nose: Congestion present.     Mouth/Throat:     Mouth: Mucous membranes are moist.     Pharynx: Oropharynx is clear.  Eyes:     Extraocular Movements: Extraocular movements intact.     Conjunctiva/sclera: Conjunctivae normal.     Pupils: Pupils are equal, round, and reactive to light.  Cardiovascular:     Rate and Rhythm: Normal rate and regular rhythm.     Pulses: Normal pulses.     Heart sounds: Normal heart sounds.  Pulmonary:     Effort: Pulmonary effort is normal.     Breath sounds: Normal breath sounds.  Musculoskeletal:     Cervical back: No tenderness.  Lymphadenopathy:     Cervical: No cervical adenopathy.  Skin:    General: Skin is warm and dry.     Capillary Refill: Capillary refill takes less than 2 seconds.  Neurological:     Mental Status: She is alert and oriented to person, place, and time.  Psychiatric:        Mood and Affect: Mood normal.        Behavior: Behavior normal.      ASSESSMENT & PLAN: Problem List Items Addressed  This Visit       Cardiovascular and Mediastinum   Essential hypertension, benign   BP Readings from Last 3 Encounters:  06/29/24 126/82  12/22/23 128/80  07/22/23 (!) 140/78   Well-controlled hypertension Continue lisinopril  40 mg daily and hydrochlorothiazide  25 mg daily Cardiovascular risk associated with hypertension discussed Dietary approaches to stop hypertension discussed        Respiratory   Chronic allergic rhinitis - Primary   Active and affecting quality of life Over-the-counter Benadryl, Claritin not working Recommend Xyzal  5 mg daily Has been using Flonase .  Continue using it Start Atrovent  nasal spray 2-3 times a day Recommend evaluation by allergist.  Referral placed today. Recommend Nettie pot and frequent use of saline nasal sprays      Relevant Medications   ipratropium (ATROVENT ) 0.06 % nasal spray   levocetirizine (XYZAL ) 5 MG tablet   Other Relevant Orders   Ambulatory referral to Allergy     Genitourinary   Stage 3b chronic kidney disease (HCC)   Chronic stable condition.  Advised to stay well-hydrated and avoid NSAIDs           Other   Dyslipidemia   Chronic stable condition Continues rosuvastatin  10 mg daily      Patient Instructions  Allergic Rhinitis, Adult  Allergic rhinitis is a reaction to allergens. Allergens are things that can cause an allergic reaction. This condition affects the lining inside the nose (mucous membrane). There are two types of allergic rhinitis: Seasonal. This type is also called hay fever. It happens only during some times of the year. Perennial. This type can happen at any time of the year. This condition cannot be spread from person to person (is not contagious). It can be mild, bad, or very bad. It can develop at any age and may be outgrown. What are the causes? Pollen from grasses, trees, and weeds. Other causes can be: Dust mites. Smoke. Mold. Car fumes. The pee (urine), spit,  or dander of pets.  Dander is dead skin cells from a pet. What increases the risk? You are more likely to develop this condition if: You have allergies in your family. You have problems like allergies in your family. You may have: Swelling of parts of your eyes and eyelids. Asthma. This affects how you breathe. Long-term redness and swelling on your skin. Food allergies. What are the signs or symptoms? The main symptom of this condition is a runny or stuffy nose (nasal congestion). Other symptoms may include: Sneezing or coughing. Itching and tearing of your eyes. Mucus that drips down the back of your throat (postnasal drip). This may cause a sore throat. Trouble sleeping. Feeling tired. Headache. How is this treated? There is no cure for this condition. You should avoid things that you are allergic to. Treatment can help to relieve symptoms. This may include: Medicines that block allergy symptoms, such as anti-inflammatories or antihistamines. These may be given as a shot, nasal spray, or pill. Avoiding things you are allergic to. Medicines that give you some of what you are allergic to over time. This is called immunotherapy. It is done if other treatments do not help. You may get: Shots. Medicine under your tongue. Stronger medicines, if other treatments do not help. Follow these instructions at home: Avoiding allergens Find out what things you are allergic to and avoid them. To do this, try these things: If you get allergies any time of year: Replace carpet with wood, tile, or vinyl flooring. Carpet can trap pet dander and dust. Do not smoke. Do not allow smoking in your home. Change your heating and air conditioning filters at least once a month. If you get allergies only some times of the year: Keep windows closed when you can. Plan things to do outside when pollen counts are lowest. Check pollen counts before you plan things to do outside. When you come indoors, change your clothes and shower  before you sit on furniture or bedding. If you are allergic to a pet: Keep the pet out of your bedroom. Vacuum, sweep, and dust often. General instructions Take over-the-counter and prescription medicines only as told by your doctor. Drink enough fluid to keep your pee pale yellow. Where to find more information American Academy of Allergy, Asthma & Immunology: aaaai.org Contact a doctor if: You have a fever. You get a cough that does not go away. You make high-pitched whistling sounds when you breathe, most often when you breathe out (wheeze). Your symptoms slow you down. Your symptoms stop you from doing your normal things each day. Get help right away if: You are short of breath. This symptom may be an emergency. Get help right away. Call 911. Do not wait to see if the symptom will go away. Do not drive yourself to the hospital. This information is not intended to replace advice given to you by your health care provider. Make sure you discuss any questions you have with your health care provider. Document Revised: 02/18/2022 Document Reviewed: 02/18/2022 Elsevier Patient Education  2024 Elsevier Inc.     Emil Schaumann, MD [facility]     [1] No Known Allergies

## 2024-06-29 NOTE — Assessment & Plan Note (Signed)
Chronic stable condition Continues rosuvastatin 10 mg daily

## 2024-06-29 NOTE — Assessment & Plan Note (Signed)
 BP Readings from Last 3 Encounters:  06/29/24 126/82  12/22/23 128/80  07/22/23 (!) 140/78   Well-controlled hypertension Continue lisinopril  40 mg daily and hydrochlorothiazide  25 mg daily Cardiovascular risk associated with hypertension discussed Dietary approaches to stop hypertension discussed

## 2024-07-15 ENCOUNTER — Telehealth: Payer: Self-pay

## 2024-07-15 NOTE — Telephone Encounter (Signed)
 Have not received a request of this. Please advise

## 2024-07-15 NOTE — Telephone Encounter (Signed)
 Copied from CRM #[id number]. Topic: Clinical - Prescription Issue >> Jul 15, 2024 11:51 AM Antwanette L wrote: Reason for CRM: Bernida from [facility] is calling to request a script for traMADol  (ULTRAM ) 50 MG tablet. He states they have faxed the request three times. The script can be faxed to [phone]. Bernida can be reached at [phone] for any questions. >> Jul 15, 2024 11:56 AM Antwanette L wrote: The rep disconnected while on hold.

## 2024-07-16 ENCOUNTER — Other Ambulatory Visit: Payer: Self-pay | Admitting: Emergency Medicine

## 2024-07-16 DIAGNOSIS — G8929 Other chronic pain: Secondary | ICD-10-CM

## 2024-07-16 MED ORDER — TRAMADOL HCL 50 MG PO TABS: 50.0000 mg | ORAL_TABLET | Freq: Two times a day (BID) | ORAL | 1 refills | Status: AC | PRN

## 2024-07-16 NOTE — Telephone Encounter (Signed)
 New prescription sent to pharmacy of record today.

## 2024-08-02 ENCOUNTER — Ambulatory Visit: Admitting: Internal Medicine

## 2024-10-11 ENCOUNTER — Ambulatory Visit
